# Patient Record
Sex: Male | Born: 1970 | Race: White | Hispanic: No | Marital: Single | Smoking: Current every day smoker
Health system: Southern US, Academic
[De-identification: ages and names within clinical notes are randomized; demographics above are authoritative.]

## PROBLEM LIST (undated history)

## (undated) DIAGNOSIS — L0390 Cellulitis, unspecified: Secondary | ICD-10-CM

## (undated) DIAGNOSIS — R7303 Prediabetes: Secondary | ICD-10-CM

## (undated) DIAGNOSIS — Z789 Other specified health status: Secondary | ICD-10-CM

## (undated) HISTORY — PX: NO PAST SURGERIES: SHX2092

## (undated) HISTORY — DX: Other specified health status: Z78.9

## (undated) HISTORY — PX: ADENOIDECTOMY: SUR15

---

## 2003-03-11 ENCOUNTER — Emergency Department (HOSPITAL_COMMUNITY): Payer: Self-pay | Admitting: EXTERNAL

## 2019-10-26 IMAGING — MR MRI LUMBAR SPINE WITHOUT CONTRAST
6 series · 48 of 48 positions shown · IV contrast (gadolinium)
Comparison: None available.

﻿EXAM:  MRI LUMBAR SPINE WITHOUT CONTRAST
INDICATION: Lower back pain.
TECHNIQUE: Multiplanar multisequential MRI of the lumbar spine was performed without gadolinium contrast.

[Series 4: T2 · sagittal · 5.0mm · 1.00mm/px · 5 of 13 slices shown (1 of 3)]
[im 1/13]
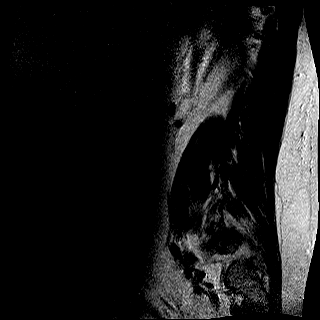
[im 4/13]
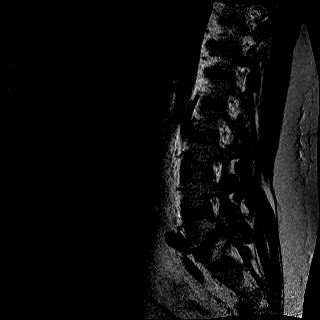
[im 7/13]
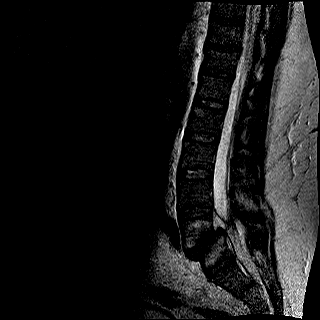
[im 10/13]
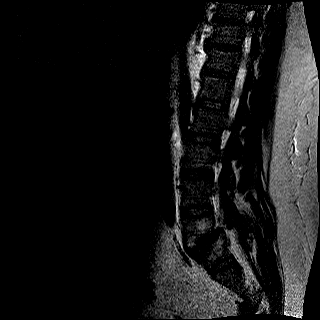
[im 13/13]
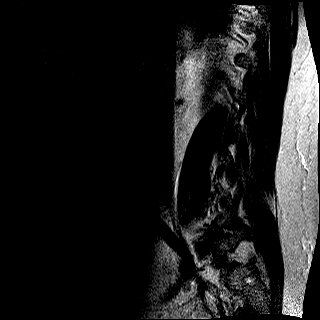

[Series 5: T1 · sagittal · 5.0mm · 1.00mm/px · 6 of 13 slices shown (1 of 2)]
[im 1/13]
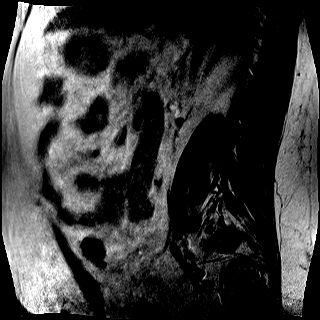
[im 3/13]
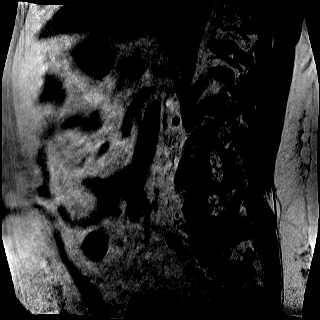
[im 5/13]
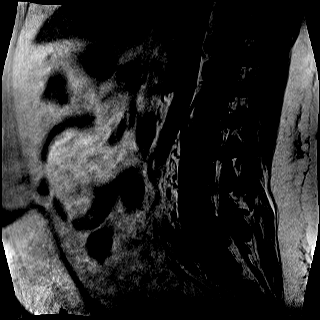
[im 8/13]
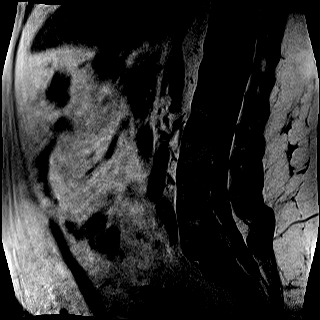
[im 10/13]
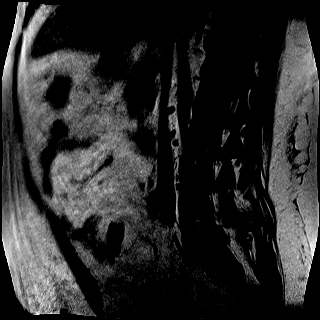
[im 13/13]
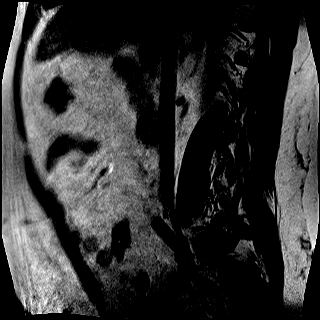

[Series 6: STIR · sagittal · 5.0mm · 1.25mm/px · 6 of 13 slices shown]
[im 1/13]
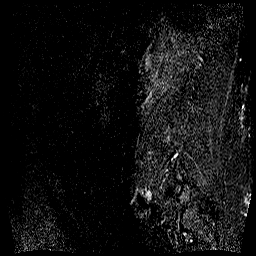
[im 3/13]
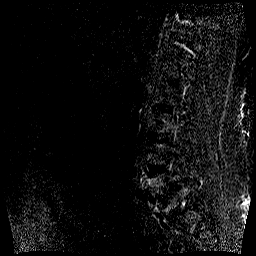
[im 5/13]
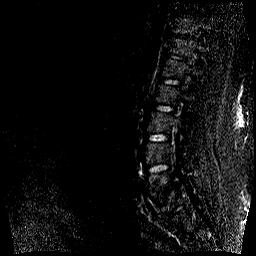
[im 8/13]
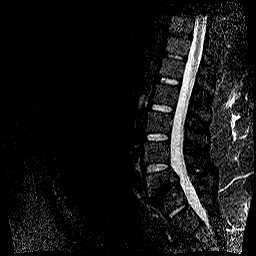
[im 10/13]
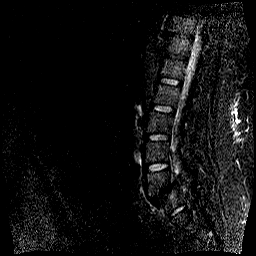
[im 13/13]
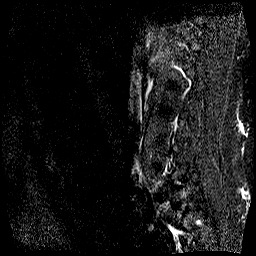

[Series 7: T2 · axial · 5.0mm · 0.89mm/px · z∈[-160,+38]mm · 11 of 25 slices shown (2 of 3)]
[im 1/25]
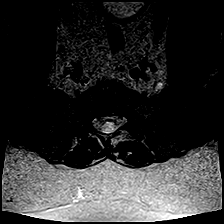
[im 3/25]
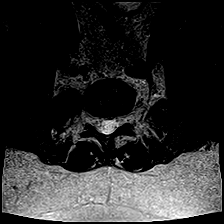
[im 5/25]
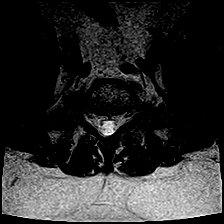
[im 8/25]
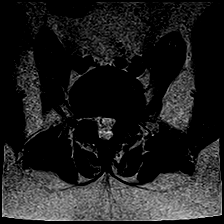
[im 10/25]
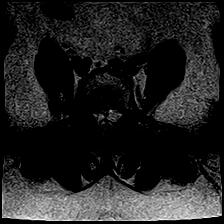
[im 13/25]
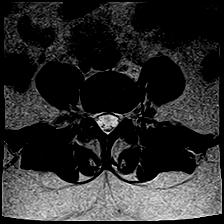
[im 15/25]
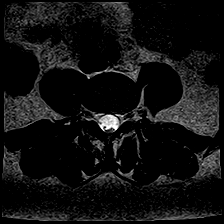
[im 17/25]
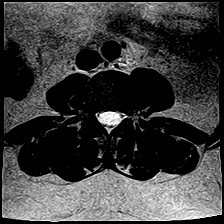
[im 20/25]
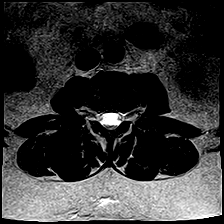
[im 22/25]
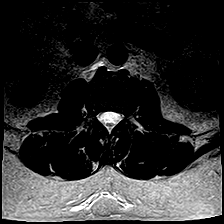
[im 25/25]
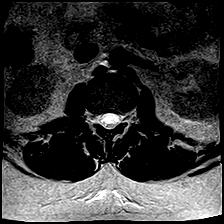

[Series 8: T1 · axial · 5.0mm · 0.89mm/px · z∈[-160,+38]mm · 11 of 25 slices shown (2 of 2)]
[im 1/25]
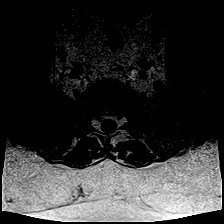
[im 3/25]
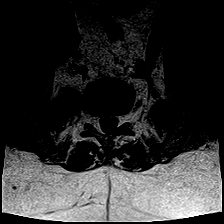
[im 5/25]
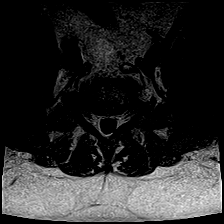
[im 8/25]
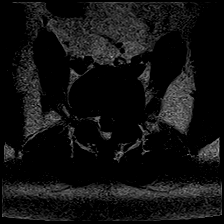
[im 10/25]
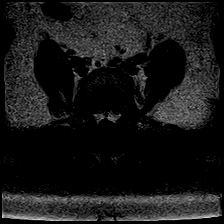
[im 13/25]
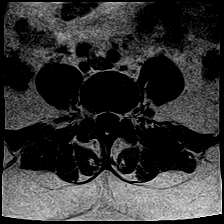
[im 15/25]
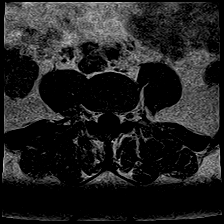
[im 17/25]
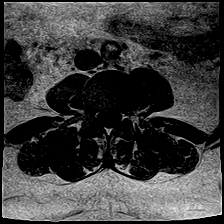
[im 20/25]
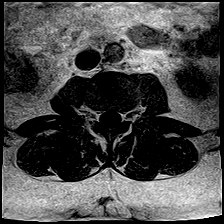
[im 22/25]
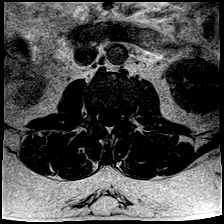
[im 25/25]
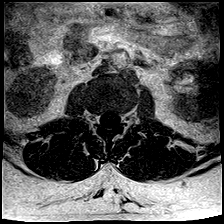

[Series 9: T2 · coronal · 4.0mm · 1.34mm/px · 9 of 20 slices shown (3 of 3)]
[im 1/20]
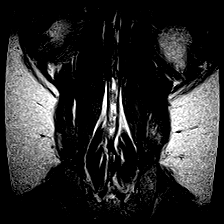
[im 3/20]
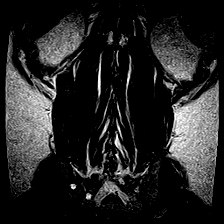
[im 5/20]
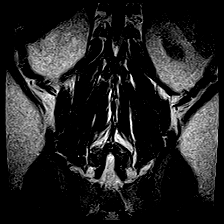
[im 8/20]
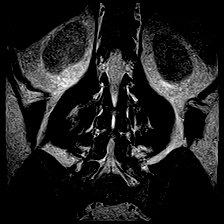
[im 10/20]
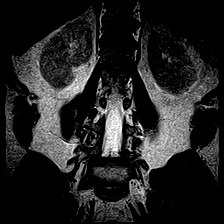
[im 12/20]
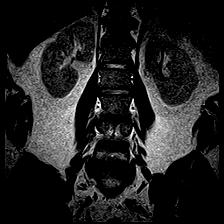
[im 15/20]
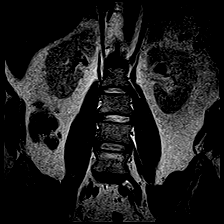
[im 17/20]
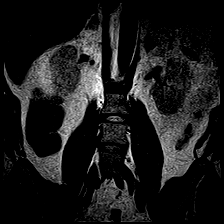
[im 20/20]
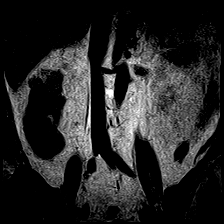

[48 of 48 positions shown; findings below may reference images not displayed]

FINDINGS: Bone marrow signal intensity is normal. There is no acute fracture or subluxation. Distal spinal cord is normal in signal intensity and terminates normally at T12 vertebral body level.

L1-2, L2-3 and L3-4 levels are unremarkable.

At L4-5 level, there is marked disc desiccation. There is also grade 1 anterolisthesis of L4 on L5 vertebral body. Modic type 2 changes are also noted within the inferior endplate of L4 and superior endplate of L5 vertebral body. There is no significant disc herniation. There is severe bilateral neural foraminal stenosis from facet arthropathy and anterolisthesis.

L5-S1 level and paraspinal soft tissues are unremarkable.
IMPRESSION: Advanced degenerative changes at L4-5 level as detailed above.

## 2019-10-26 IMAGING — MR MRI PELVIS WITHOUT CONTRAST
4 of 7 series · 21 of 48 positions shown · IV contrast (gadolinium)
Comparison: None available.

﻿EXAM:  MRI PELVIS WITHOUT CONTRAST
INDICATION: Sacral pain.
TECHNIQUE: Multiplanar multisequential MRI of the pelvis was performed without gadolinium contrast.

[Series 11: T1 · axial · 5.0mm · 0.78mm/px · z∈[-70,+112]mm · 8 of 34 slices shown (1 of 2)]
[im 1/34]
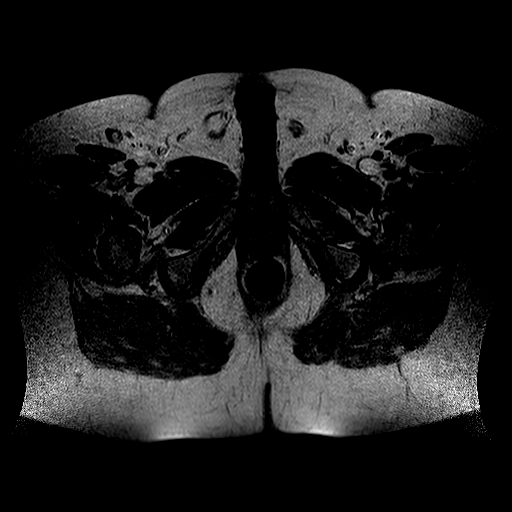
[im 5/34]
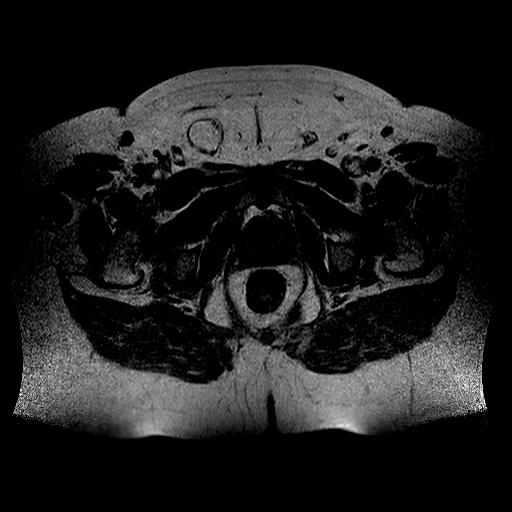
[im 10/34]
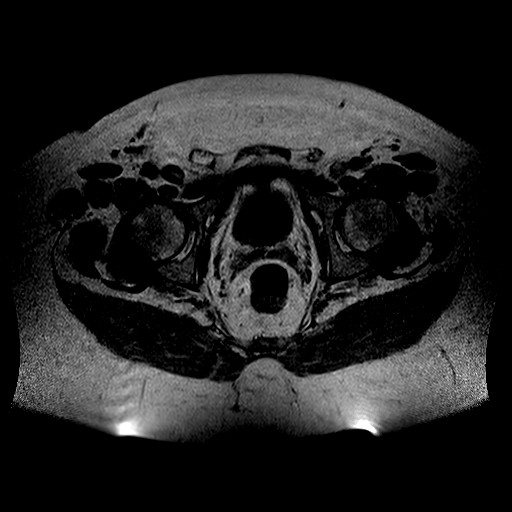
[im 15/34]
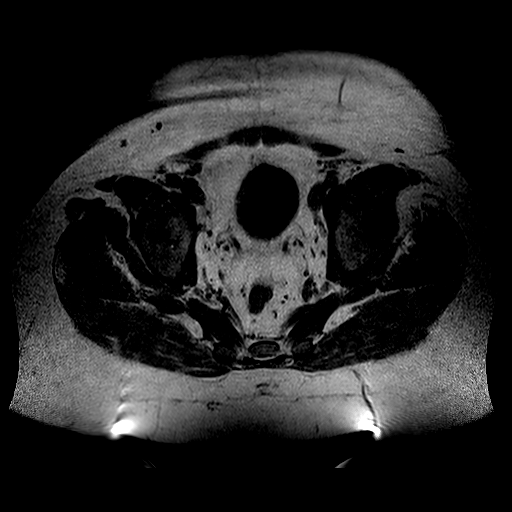
[im 19/34]
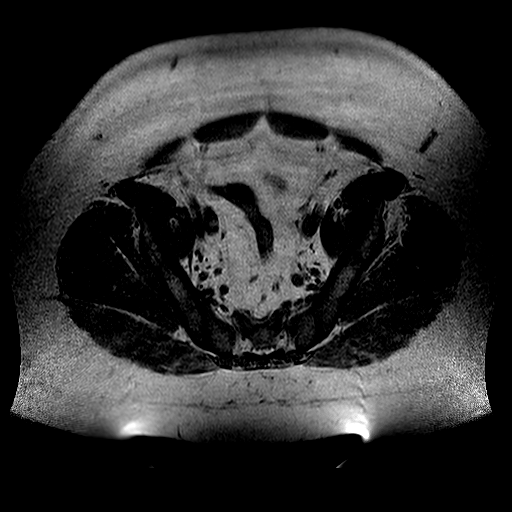
[im 24/34]
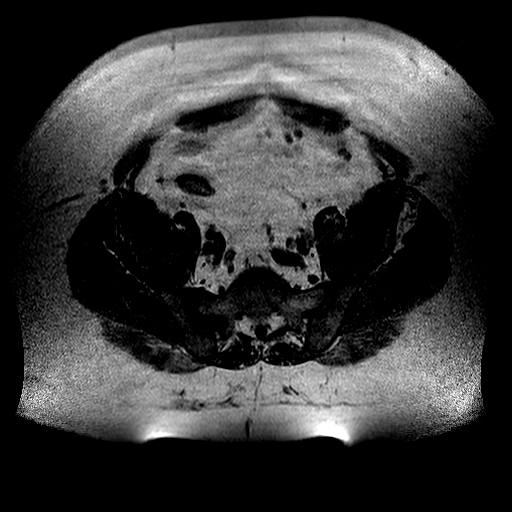
[im 29/34]
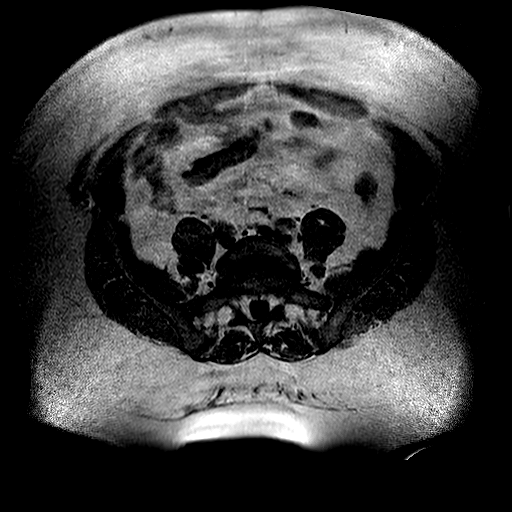
[im 34/34]
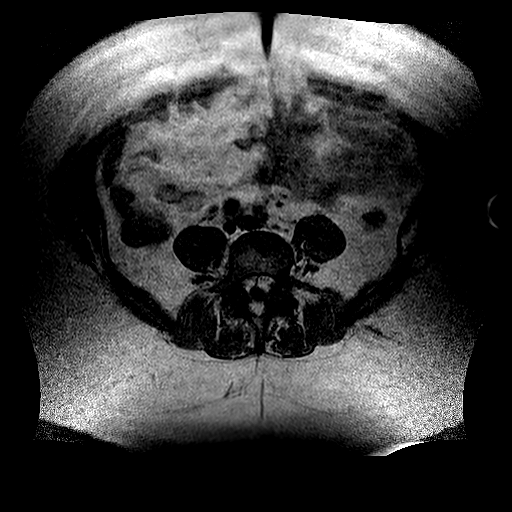

[Series 12: STIR · axial · 5.0mm · 0.78mm/px · z∈[-70,+84]mm · 7 of 34 slices shown]
[im 1/34]
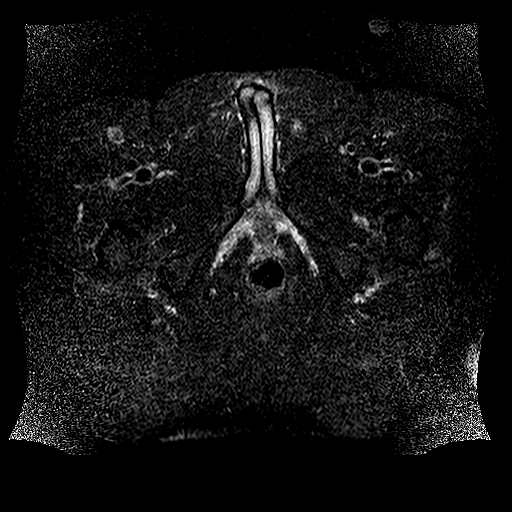
[im 5/34]
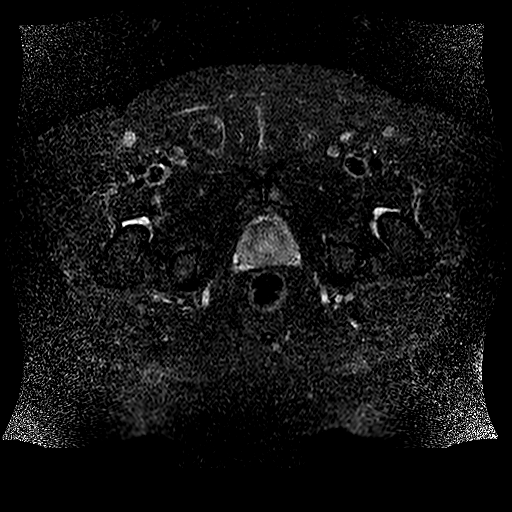
[im 10/34]
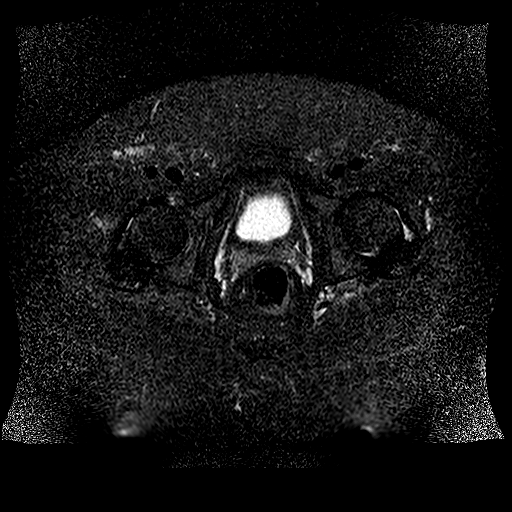
[im 15/34]
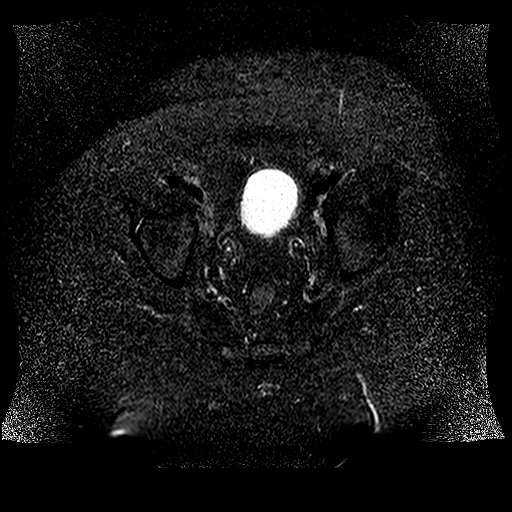
[im 19/34]
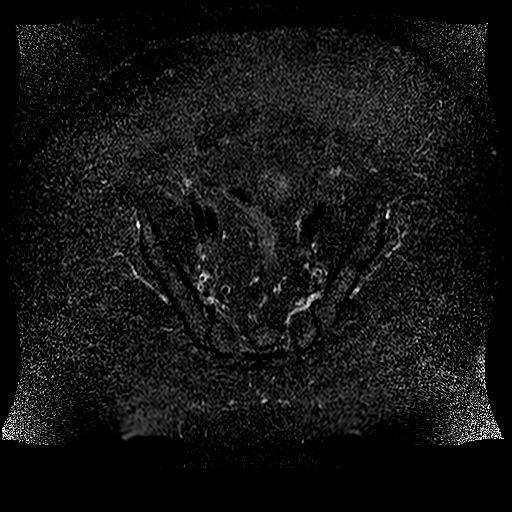
[im 24/34]
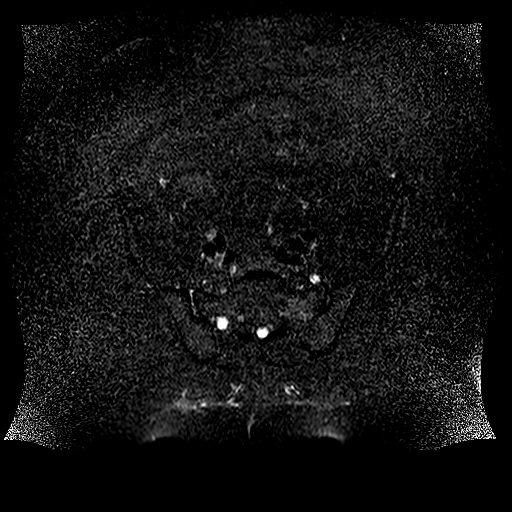
[im 29/34]
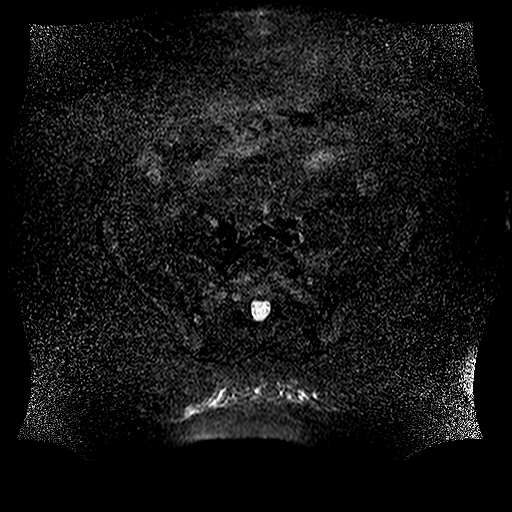

[Series 13: T2 fat-sat · axial · 5.0mm · 1.25mm/px · z∈[-42,+79]mm · 3 of 34 slices shown]
[im 6/34]
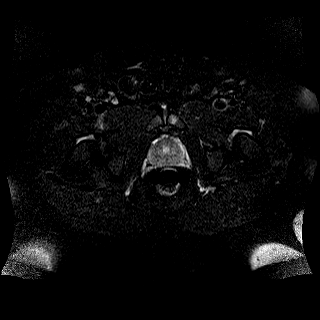
[im 17/34]
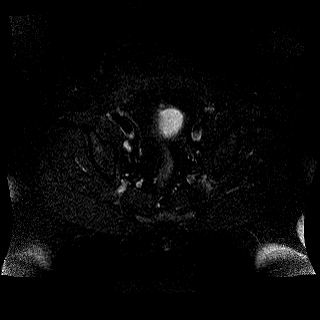
[im 28/34]
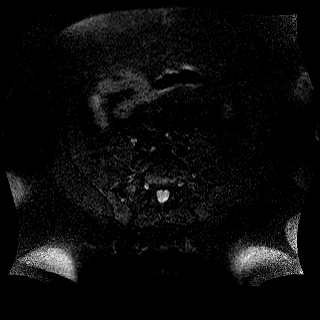

[Series 16: T1 · coronal · 5.0mm · 0.78mm/px · 3 of 28 slices shown (2 of 2)]
[im 6/28]
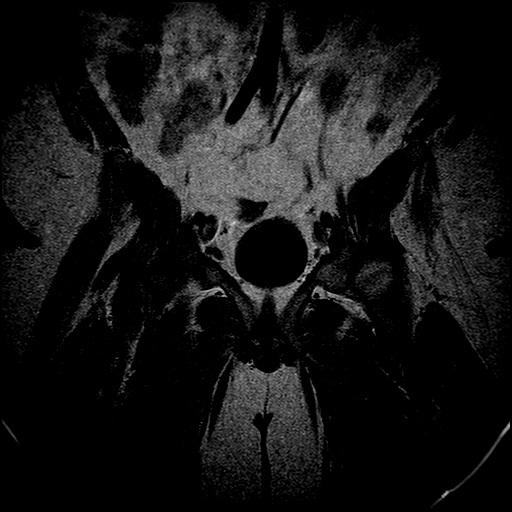
[im 17/28]
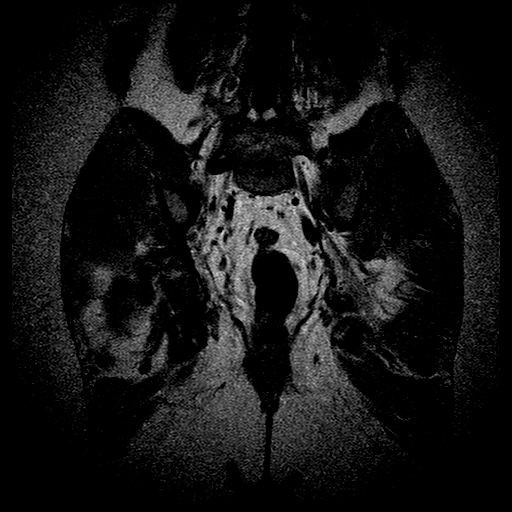
[im 28/28]
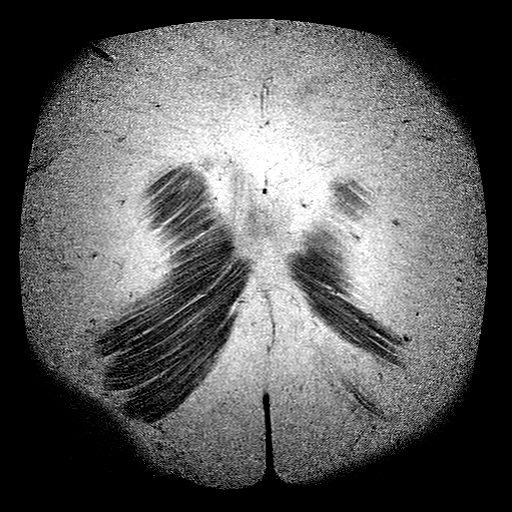

[21 of 48 positions shown; findings below may reference images not displayed]

FINDINGS: Examination is somewhat degraded due to patient's extremely large body habitus. Bone marrow signal intensity is normal. There is no acute fracture or subluxation. There is moderate osteoarthritis of the right sacroiliac joint with increased subarticular sclerosis and cystic changes within the right sacrum and posterior ilium. Hip joints are well maintained. There is no evidence of iliopsoas or greater trochanteric bursitis on either side. Additional muscles and soft tissues about the pelvic girdle also appear unremarkable. Evaluation of pelvic parenchymal structures is limited without definite abnormality. A right-sided fat containing inguinal hernia is incidentally noted.
IMPRESSION: Moderate osteoarthritis of the right sacroiliac joint.

## 2021-04-03 ENCOUNTER — Emergency Department
Admission: EM | Admit: 2021-04-03 | Discharge: 2021-04-03 | Disposition: A | Discharge status: Home / Self Care | Payer: No Typology Code available for payment source | Attending: Emergency Medicine | Admitting: Emergency Medicine

## 2021-04-03 ENCOUNTER — Other Ambulatory Visit: Payer: Self-pay

## 2021-04-03 ENCOUNTER — Emergency Department (EMERGENCY_DEPARTMENT_HOSPITAL): Payer: No Typology Code available for payment source

## 2021-04-03 ENCOUNTER — Encounter (HOSPITAL_COMMUNITY): Payer: Self-pay | Admitting: Emergency Medicine

## 2021-04-03 DIAGNOSIS — G8929 Other chronic pain: Secondary | ICD-10-CM | POA: Insufficient documentation

## 2021-04-03 DIAGNOSIS — M25559 Pain in unspecified hip: Secondary | ICD-10-CM

## 2021-04-03 DIAGNOSIS — M79604 Pain in right leg: Secondary | ICD-10-CM

## 2021-04-03 DIAGNOSIS — M549 Dorsalgia, unspecified: Secondary | ICD-10-CM | POA: Insufficient documentation

## 2021-04-03 DIAGNOSIS — M5136 Other intervertebral disc degeneration, lumbar region: Secondary | ICD-10-CM | POA: Insufficient documentation

## 2021-04-03 DIAGNOSIS — M25551 Pain in right hip: Secondary | ICD-10-CM

## 2021-04-03 MED ORDER — ACETAMINOPHEN 325 MG TABLET
ORAL_TABLET | ORAL | Status: AC
Start: 2021-04-03 — End: 2021-04-03
  Filled 2021-04-03: qty 3

## 2021-04-03 MED ORDER — KETOROLAC 30 MG/ML (1 ML) INJECTION SOLUTION
INTRAMUSCULAR | Status: AC
Start: 2021-04-03 — End: 2021-04-03
  Filled 2021-04-03: qty 1

## 2021-04-03 MED ORDER — KETOROLAC 30 MG/ML (1 ML) INJECTION SOLUTION
15.0000 mg | INTRAMUSCULAR | Status: AC
Start: 2021-04-03 — End: 2021-04-03
  Administered 2021-04-03: 15 mg via INTRAMUSCULAR

## 2021-04-03 MED ORDER — ACETAMINOPHEN 325 MG TABLET
975.0000 mg | ORAL_TABLET | ORAL | Status: AC
Start: 2021-04-03 — End: 2021-04-03
  Administered 2021-04-03: 13:00:00 975 mg via ORAL

## 2021-04-03 MED ORDER — DEXAMETHASONE SODIUM PHOSPHATE (PF) 10 MG/ML INJECTION SOLUTION
10.0000 mg | INTRAMUSCULAR | Status: AC
Start: 2021-04-03 — End: 2021-04-03
  Administered 2021-04-03: 14:00:00 10 mg via INTRAMUSCULAR

## 2021-04-03 MED ORDER — DEXAMETHASONE SODIUM PHOSPHATE (PF) 10 MG/ML INJECTION SOLUTION
INTRAMUSCULAR | Status: AC
Start: 2021-04-03 — End: 2021-04-03
  Filled 2021-04-03: qty 1

## 2021-04-03 NOTE — ED Nurses Note (Signed)
D/c instructions given and states understanding. D/c to home.

## 2021-04-03 NOTE — ED Provider Notes (Signed)
[facility]  ED Primary Provider Note  Patient Name: Charles Mendoza  Patient Age: 51 y.o.  Date of Birth: [birth year]    Chief Complaint: Hip Pain and Leg Pain        History of Present Illness       Charles Mendoza is a 51 y.o. male who had concerns including Hip Pain and Leg Pain.  The patient works in the kitchen, and had a right lateral leg related discomfort.  He denies bowel or bladder changes, saddle anesthesia, denies any overt weakness.  Patient has history of chronic back pain well, and has received an MRI in the past.  Patient self admits he believes that he has sciatic nerve.        Review of Systems     No other overt Review of Systems are noted to be positive except noted in the HPI.      Historical Data   History Reviewed This Encounter: Medical History   Surgical History   Family History   Social History        Physical Exam   ED Triage Vitals [04/03/21 1128]   BP (Non-Invasive) 107/68   Heart Rate (!) 110   Respiratory Rate 20   Temperature 36.3 C (97.3 F)   SpO2 100 %   Weight (!) 145 kg (320 lb)   Height 1.676 m (5\' 6" )       Nursing notes reviewed for what could be assessed. Past Medical, Surgical, and Social history reviewed for what has been completed. Exam limited in the setting of personal protective equipment.    Constitutional: NAD. Well-Developed. Well Nourished.  Head: Normocephalic, atraumatic.  Ears:  Normal to exterior evaluation  Eyes: EOM grossly intact, conjunctiva normal.  Neck: Supple  Cardiovascular: Regular Rate and Rhythm, extremities well perfused.  Pulmonary/Chest: No respiratory distress. Lungs are symmetric to auscultation bilaterally.  Abdominal: Soft, non-tender, non-distended. Non peritoneal, no rebound, no guarding.  MSK: No Lower Extremity Edema.  No midline back tenderness to palpation.  Right lateral muscular discomfort to palpation.  Skin: Warm, dry, and intact  Neuro: Appropriate, CN II-XII grossly intact.  No sensory deficit in the  right lower extremity.  Psych: Pleasant            Procedures      Patient Data   Labs Ordered/Reviewed - No data to display    XR HIP RIGHT W PELVIS 2-3 VIEWS   Final Result by Edi, Radresults In (03/28 1307)   1. Unremarkable plain film exam of the bony pelvis and right hip.   2. 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   3. No significant change since prior study.         Radiologist location ID: [id number]             Medical Decision Making          MDM      Studies Assessed:  Radiography          MDM Narrative:  This patient is a 51 year old male who presents after right leg pain sustained while working.  Patient states that it her on the lateral aspect of his leg, to around his ankle.  Patient has history of obtaining an MRI due to chronic related back pain resulting in no acute intervention.  The patient specifically denies cauda equina red flag findings.  After the patient received symptom based treatment, he underwent discussion of his x-ray, and was discharged with  return precautions and follow-up with his primary care provider.             Medications Administered in the ED   dexamethasone (PF) 10 mg/mL injection (10 mg IntraMUSCULAR Given 04/03/21 1349)   ketorolac (TORADOL) 30 mg/mL injection (15 mg IntraMUSCULAR Given 04/03/21 1348)   acetaminophen (TYLENOL) tablet (975 mg Oral Given 04/03/21 1300)       Following the history, physical exam, and ED workup, the patient was deemed stable and suitable for discharge. The patient/caregiver was advised to return to the ED for any new or worsening symptoms. Discharge medications, and follow-up instructions were discussed with the patient/caregiver in detail, who verbalizes understanding. The patient/caregiver is in agreement and is comfortable with the plan of care.    Disposition: Discharged         Current Discharge Medication List      You have not been prescribed any medications.       Follow up:   Mariah Milling, DO  [address]  [place]  [address]  [phone]    Call today                 Clinical Impression   Right leg pain (Primary)         There are no discharge medications for this patient.        Aleatha Borer, MD, [facility]  Department of Emergency Medicine

## 2021-04-03 NOTE — ED Triage Notes (Signed)
States pain in right hip and leg for over a year. Mri last year normal. States pain worse today and difficult to walk. No new injury.

## 2021-04-03 NOTE — Discharge Instructions (Addendum)
Thank you for allowing [place] to be part of your care.    XR IMPRESSION:  1. Unremarkable plain film exam of the bony pelvis and right hip.  2. 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  3. No significant change since prior study.    If you notice any bowel or bladder changes, or numbness, please reevaluated.    Please discuss all medications with your pharmacist to ensure there are no concerns of interactions.    Please ensure all questions or concerns are addressed prior to leaving the hospital. We want to make sure your concerns are addressed to make sure you are as safe and healthy as possible. By leaving the hospital, it is understood you are in agreement with your treatment plan.    You may have received sedating medication during your visit. Please discuss this with your discharging provider nurse as you may not be able to operate machines while the medication is in your system, or while you are taking any potentially sedating prescriptions.    Please call the hospital medical records office for a copy of your finalized results, and review them with a primary care physician, for any findings needing further attention.    If you feel your situation worsens, or does not get better in 48 hours, please see a physician for evaluation.    We encourage you to see your regular doctor as soon as possible to let them know you were seen in the emergency department. They may want to do further testing. If you do not have a doctor, please feel free to call the hospital, and ask for contact information of accepting providers. Please also discuss your vaccinations, and ensure all are up to date.

## 2021-04-04 ENCOUNTER — Telehealth (HOSPITAL_COMMUNITY): Payer: Self-pay | Admitting: Family Medicine

## 2021-04-04 NOTE — Telephone Encounter (Signed)
Post Ed Follow-Up    Post ED Follow-Up:   Document completed and/or attempted interactive contact(s) after transition to home after emergency department stay.:   Transition Facility and relevant Date:   Discharge Date: 04/03/21  Discharge from [facility] Emergency Department?: Yes  Discharge Facility: [facility]  Contacted by: Glorianne Manchester, RN  Contact method: MyChart Patient Portal  Contact completed: 04/04/2021  5:21 PM  MyChart message sent?: Yes  Interventions: no needs identified, patient provided Nurse Navigator contact number, availability, and services available

## 2021-04-11 ENCOUNTER — Other Ambulatory Visit: Payer: Self-pay

## 2021-08-31 ENCOUNTER — Other Ambulatory Visit (HOSPITAL_COMMUNITY): Payer: Self-pay | Admitting: Family Medicine

## 2021-08-31 ENCOUNTER — Encounter (HOSPITAL_COMMUNITY): Payer: Self-pay

## 2021-08-31 DIAGNOSIS — M79604 Pain in right leg: Secondary | ICD-10-CM

## 2021-09-06 ENCOUNTER — Encounter (HOSPITAL_COMMUNITY): Payer: Self-pay

## 2021-09-10 ENCOUNTER — Emergency Department
Admission: EM | Admit: 2021-09-10 | Discharge: 2021-09-10 | Disposition: A | Discharge status: Home / Self Care | Payer: No Typology Code available for payment source | Attending: Emergency Medicine | Admitting: Emergency Medicine

## 2021-09-10 ENCOUNTER — Encounter (HOSPITAL_COMMUNITY): Payer: Self-pay

## 2021-09-10 ENCOUNTER — Emergency Department (HOSPITAL_COMMUNITY): Payer: No Typology Code available for payment source

## 2021-09-10 ENCOUNTER — Other Ambulatory Visit: Payer: Self-pay

## 2021-09-10 DIAGNOSIS — R531 Weakness: Secondary | ICD-10-CM

## 2021-09-10 DIAGNOSIS — R000 Tachycardia, unspecified: Secondary | ICD-10-CM

## 2021-09-10 DIAGNOSIS — R059 Cough, unspecified: Secondary | ICD-10-CM | POA: Insufficient documentation

## 2021-09-10 DIAGNOSIS — U071 COVID-19: Secondary | ICD-10-CM

## 2021-09-10 LAB — ARTERIAL BLOOD GAS/LACTATE
%FIO2 (ARTERIAL): 21 %
BASE EXCESS (ARTERIAL): 3.1 mmol/L — ABNORMAL HIGH (ref <=2.0)
BICARBONATE (ARTERIAL): 26.4 mmol/L — ABNORMAL HIGH (ref 20.0–26.0)
CARBOXYHEMOGLOBIN: 4.2 % — ABNORMAL HIGH (ref <=1.5)
LACTATE: 0.7 mmol/L (ref <=4.0)
MET-HEMOGLOBIN: 0.2 % (ref <=2.0)
O2CT: 19.7 %
OXYHEMOGLOBIN: 89.6 % (ref 88.0–100.0)
PCO2 (ARTERIAL): 45 mm/Hg (ref 35–45)
PH (ARTERIAL): 7.4 (ref 7.35–7.45)
PO2 (ARTERIAL): 63 mm/Hg — ABNORMAL LOW (ref 80–100)

## 2021-09-10 LAB — LACTIC ACID LEVEL W/ REFLEX FOR LEVEL >2.0: LACTIC ACID: 0.7 mmol/L (ref 0.5–2.2)

## 2021-09-10 LAB — CBC WITH DIFF
BASOPHIL #: 0 10*3/uL (ref 0.00–0.30)
BASOPHIL %: 1 % (ref 0–3)
EOSINOPHIL #: 0.1 10*3/uL (ref 0.00–0.80)
EOSINOPHIL %: 1 % (ref 0–7)
HCT: 41.2 % — ABNORMAL LOW (ref 42.0–51.0)
HGB: 14 g/dL (ref 13.5–18.0)
LYMPHOCYTE #: 0.2 10*3/uL — ABNORMAL LOW (ref 1.10–5.00)
LYMPHOCYTE %: 4 % — ABNORMAL LOW (ref 25–45)
MCH: 30.7 pg (ref 27.0–32.0)
MCHC: 34 g/dL (ref 32.0–36.0)
MCV: 90.3 fL (ref 78.0–99.0)
MONOCYTE #: 1 10*3/uL (ref 0.00–1.30)
MONOCYTE %: 16 % — ABNORMAL HIGH (ref 0–12)
MPV: 7.7 fL (ref 7.4–10.4)
NEUTROPHIL #: 4.7 10*3/uL (ref 1.80–8.40)
NEUTROPHIL %: 78 % — ABNORMAL HIGH (ref 40–76)
PLATELETS: 174 10*3/uL (ref 140–440)
RBC: 4.57 10*6/uL (ref 4.20–6.00)
RDW: 13.5 % (ref 11.6–14.8)
WBC: 6 10*3/uL (ref 4.0–10.5)
WBCS UNCORRECTED: 6 10*3/uL

## 2021-09-10 LAB — BLUE TOP TUBE

## 2021-09-10 LAB — COVID-19, FLU A/B, RSV RAPID BY PCR
INFLUENZA VIRUS TYPE A: NOT DETECTED
INFLUENZA VIRUS TYPE B: NOT DETECTED
RESPIRATORY SYNCTIAL VIRUS (RSV): NOT DETECTED
SARS-CoV-2: DETECTED — AB

## 2021-09-10 LAB — COMPREHENSIVE METABOLIC PANEL, NON-FASTING
ALBUMIN/GLOBULIN RATIO: 1.7 — ABNORMAL HIGH (ref 0.8–1.4)
ALBUMIN: 4.3 g/dL (ref 3.5–5.7)
ALKALINE PHOSPHATASE: 55 U/L (ref 34–104)
ALT (SGPT): 32 U/L (ref 7–52)
ANION GAP: 6 mmol/L (ref 4–13)
AST (SGOT): 20 U/L (ref 13–39)
BILIRUBIN TOTAL: 0.4 mg/dL (ref 0.3–1.2)
BUN/CREA RATIO: 17 (ref 6–22)
BUN: 14 mg/dL (ref 7–25)
CALCIUM, CORRECTED: 9 mg/dL (ref 8.9–10.8)
CALCIUM: 9.3 mg/dL (ref 8.6–10.3)
CHLORIDE: 103 mmol/L (ref 98–107)
CO2 TOTAL: 26 mmol/L (ref 21–31)
CREATININE: 0.83 mg/dL (ref 0.60–1.30)
ESTIMATED GFR: 108 mL/min/{1.73_m2} (ref >59)
GLOBULIN: 2.5 — ABNORMAL LOW (ref 2.9–5.4)
GLUCOSE: 100 mg/dL (ref 74–109)
OSMOLALITY, CALCULATED: 271 mOsm/kg (ref 270–290)
POTASSIUM: 4.2 mmol/L (ref 3.5–5.1)
PROTEIN TOTAL: 6.8 g/dL (ref 6.4–8.9)
SODIUM: 135 mmol/L — ABNORMAL LOW (ref 136–145)

## 2021-09-10 LAB — GOLD TOP TUBE

## 2021-09-10 LAB — LIGHT GREEN TOP TUBE

## 2021-09-10 MED ORDER — CETIRIZINE 10 MG TABLET
10.0000 mg | ORAL_TABLET | Freq: Every day | ORAL | 0 refills | Status: AC
Start: 2021-09-10 — End: 2021-09-24

## 2021-09-10 MED ORDER — TAMSULOSIN 0.4 MG CAPSULE
0.4000 mg | ORAL_CAPSULE | ORAL | 0 refills | Status: DC
Start: 2021-09-10 — End: 2021-09-10

## 2021-09-10 MED ORDER — IPRATROPIUM 0.5 MG-ALBUTEROL 3 MG (2.5 MG BASE)/3 ML NEBULIZATION SOLN
6.0000 mL | INHALATION_SOLUTION | RESPIRATORY_TRACT | Status: AC
Start: 2021-09-10 — End: 2021-09-10
  Administered 2021-09-10: 6 mL via RESPIRATORY_TRACT

## 2021-09-10 MED ORDER — SODIUM CHLORIDE 0.9 % IV BOLUS
1000.0000 mL | INJECTION | Status: AC
Start: 2021-09-10 — End: 2021-09-10
  Administered 2021-09-10: 0 mL via INTRAVENOUS
  Administered 2021-09-10: 1000 mL via INTRAVENOUS

## 2021-09-10 MED ORDER — ACETAMINOPHEN 325 MG TABLET
650.0000 mg | ORAL_TABLET | ORAL | Status: AC
Start: 2021-09-10 — End: 2021-09-10
  Administered 2021-09-10: 650 mg via ORAL

## 2021-09-10 MED ORDER — FLUTICASONE PROPIONATE 50 MCG/ACTUATION NASAL SPRAY,SUSPENSION
2.0000 | Freq: Every day | NASAL | 0 refills | Status: AC
Start: 2021-09-10 — End: 2021-09-17

## 2021-09-10 MED ORDER — ACETAMINOPHEN 325 MG TABLET
ORAL_TABLET | ORAL | Status: AC
Start: 2021-09-10 — End: 2021-09-10
  Filled 2021-09-10: qty 2

## 2021-09-10 MED ORDER — IPRATROPIUM 0.5 MG-ALBUTEROL 3 MG (2.5 MG BASE)/3 ML NEBULIZATION SOLN
INHALATION_SOLUTION | RESPIRATORY_TRACT | Status: AC
Start: 2021-09-10 — End: 2021-09-10
  Filled 2021-09-10: qty 6

## 2021-09-10 MED ORDER — NAPROXEN 500 MG TABLET
500.0000 mg | ORAL_TABLET | Freq: Two times a day (BID) | ORAL | 0 refills | Status: DC | PRN
Start: 2021-09-10 — End: 2022-01-13

## 2021-09-10 MED ORDER — SODIUM CHLORIDE 0.9 % (FLUSH) INJECTION SYRINGE
3.0000 mL | INJECTION | Freq: Three times a day (TID) | INTRAMUSCULAR | Status: DC
Start: 2021-09-10 — End: 2021-09-10
  Administered 2021-09-10: 3 mL

## 2021-09-10 MED ORDER — ACETAMINOPHEN 500 MG TABLET
1000.0000 mg | ORAL_TABLET | Freq: Three times a day (TID) | ORAL | 0 refills | Status: AC
Start: 2021-09-10 — End: 2021-09-17

## 2021-09-10 MED ORDER — PAXLOVID 300 MG (150 MG X 2)-100 MG TABLETS IN A DOSE PACK
3.0000 | ORAL_TABLET | Freq: Two times a day (BID) | ORAL | 0 refills | Status: AC
Start: 2021-09-10 — End: 2021-09-15

## 2021-09-10 MED ORDER — SODIUM CHLORIDE 0.9 % (FLUSH) INJECTION SYRINGE
3.0000 mL | INJECTION | INTRAMUSCULAR | Status: DC | PRN
Start: 2021-09-10 — End: 2021-09-10

## 2021-09-10 MED ORDER — ALBUTEROL SULFATE HFA 90 MCG/ACTUATION AEROSOL INHALER
2.0000 | INHALATION_SPRAY | RESPIRATORY_TRACT | 2 refills | Status: DC | PRN
Start: 2021-09-10 — End: 2022-05-30

## 2021-09-10 NOTE — ED Provider Notes (Signed)
[facility]  ED Primary Provider Note  Patient Name: Charles Mendoza  Patient Age: 51 y.o.  Date of Birth: Jan 10, 1970    Chief Complaint: Cough, Congestion, and Weakness        History of Present Illness       Charles Mendoza is a 51 y.o. male who had concerns including Cough, Congestion, and Weakness.  This patient is a 51 year old male who presents continual cough.  This began within the day prior to arrival, on his way to work.  He is never carried a diagnosis of COVID.        Review of Systems     No other overt Review of Systems are noted to be positive except noted in the HPI.      Historical Data   History Reviewed This Encounter:        Physical Exam   ED Triage Vitals [09/10/21 1453]   BP (Non-Invasive) (!) 127/92   Heart Rate (!) 125   Respiratory Rate 17   Temperature 37.9 C (100.2 F)   SpO2 94 %   Weight (!) 145 kg (320 lb)   Height 1.727 m ('5\' 8"' )         Nursing notes reviewed for what could be assessed. Past Medical, Surgical, and Social history reviewed for what has been completed.    Constitutional: NAD. Well-Developed. Well Nourished.  BMI 48.  Head: Normocephalic, atraumatic.  Mouth/Throat:  Wearing a mask.  Eyes: EOM grossly intact, conjunctiva normal.  Neck: Supple  Cardiovascular:  Tachycardic Rate and Rhythm, extremities well perfused.  Pulmonary/Chest: No respiratory distress. Lungs are symmetric to auscultation bilaterally.  No obvious rhonchi.  Abdominal: Soft, non-tender, non-distended. Non peritoneal, no rebound, no guarding.  MSK: No Lower Extremity Edema.  Skin: Warm, dry, and intact  Neuro: Appropriate, CN II-XII grossly intact.   Psych: Pleasant              Procedures      Patient Data     Labs Ordered/Reviewed   COMPREHENSIVE METABOLIC PANEL, NON-FASTING - Abnormal; Notable for the following components:       Result Value    SODIUM 135 (*)     ALBUMIN/GLOBULIN RATIO 1.7 (*)     GLOBULIN 2.5 (*)     All other components within normal limits     Narrative:     Estimated Glomerular Filtration Rate (eGFR) is calculated using the CKD-EPI (2021) equation, intended for patients 41 years of age and older. If gender is not documented or "unknown", there will be no eGFR calculation.   COVID-19, FLU A/B, RSV RAPID BY PCR - Abnormal; Notable for the following components:    SARS-CoV-2 Detected (*)     All other components within normal limits    Narrative:     Results are for the simultaneous qualitative identification of SARS-CoV-2 (formerly 2019-nCoV), Influenza A, Influenza B, and RSV RNA. These etiologic agents are generally detectable in nasopharyngeal and nasal swabs during the ACUTE PHASE of infection. Hence, this test is intended to be performed on respiratory specimens collected from individuals with signs and symptoms of upper respiratory tract infection who meet Centers for Disease Control and Prevention (CDC) clinical and/or epidemiological criteria for Coronavirus Disease 2019 (COVID-19) testing. CDC COVID-19 criteria for testing on human specimens is available at [facility] webpage information for Healthcare Professionals: Coronavirus Disease 2019 (COVID-19) ([employer]).     False-negative results may occur if the virus has genomic mutations, insertions, deletions,  or rearrangements or if performed very early in the course of illness. Otherwise, negative results indicate virus specific RNA targets are not detected, however negative results do not preclude SARS-CoV-2 infection/COVID-19, Influenza, or Respiratory syncytial virus infection. Results should not be used as the sole basis for patient management decisions. Negative results must be combined with clinical observations, patient history, and epidemiological information. If upper respiratory tract infection is still suspected based on exposure history together with other clinical findings, re-testing should be considered.    Disclaimer:   This assay has been  authorized by FDA under an Emergency Use Authorization for use in laboratories certified under the Clinical Laboratory Improvement Amendments of 1988 (CLIA), 42 U.S.C. 250 173 2484, to perform high complexity tests. The impacts of vaccines, antiviral therapeutics, antibiotics, chemotherapeutic or immunosuppressant drugs have not been evaluated.     Test methodology:   Cepheid Xpert Xpress SARS-CoV-2/Flu/RSV Assay real-time polymerase chain reaction (RT-PCR) test on the GeneXpert Dx and Xpert Xpress systems.   CBC WITH DIFF - Abnormal; Notable for the following components:    HCT 41.2 (*)     NEUTROPHIL % 78 (*)     LYMPHOCYTE % 4 (*)     MONOCYTE % 16 (*)     LYMPHOCYTE # 0.20 (*)     All other components within normal limits   ARTERIAL BLOOD GAS/LACTATE - Abnormal; Notable for the following components:    BICARBONATE (ARTERIAL) 26.4 (*)     BASE EXCESS (ARTERIAL) 3.1 (*)     CARBOXYHEMOGLOBIN 4.2 (*)     PO2 (ARTERIAL) 63 (*)     All other components within normal limits   LACTIC ACID LEVEL W/ REFLEX FOR LEVEL >2.0 - Normal   ADULT ROUTINE BLOOD CULTURE, SET OF 2 BOTTLES (BACTERIA AND YEAST)   ADULT ROUTINE BLOOD CULTURE, SET OF 2 BOTTLES (BACTERIA AND YEAST)   CBC/DIFF    Narrative:     The following orders were created for panel order CBC/DIFF.  Procedure                               Abnormality         Status                     ---------                               -----------         ------                     CBC WITH WJXB[[id number]]                Abnormal            Final result                 Please view results for these tests on the individual orders.   BLUE TOP TUBE   EXTRA TUBES    Narrative:     The following orders were created for panel order EXTRA TUBES.  Procedure                               Abnormality         Status                     ---------                               -----------         ------  BLUE TOP QMGQ[[id number]]                                    Final result                GOLD TOP OIZT[[id number]]                                    In process                 LIGHT GREEN TOP JASN[[id number]]                             In process                   Please view results for these tests on the individual orders.   GOLD TOP TUBE   LIGHT GREEN TOP TUBE       XR CHEST AP   Final Result by Edi, Radresults In (09/04 1610)   NO ACUTE FINDINGS.         Radiologist location ID: [place] Decision Making          Medical Decision Making        Studies Assessed:  Lab, radiology          MDM Narrative:  This patient is a 51 year old male who presents with upper respiratory type symptoms including coughing.  He was also tachycardic.  His symptoms did improve with therapy.  He did test positive for COVID-19.  I did have discussion regarding Paxlovid in offered the patient the option to proceed with, or not proceed with this type treatment.  Also discussed the package insert he would need to review. Patient does not appear to have any contraindications to the therapy at this point in time.             Medications Administered in the ED   NS flush syringe (3 mL Intracatheter Given 09/10/21 1500)   NS flush syringe (has no administration in time range)   NS bolus infusion 1,000 mL (0 mL Intravenous Stopped 09/10/21 1728)   acetaminophen (TYLENOL) tablet (650 mg Oral Given 09/10/21 1528)   ipratropium-albuterol 0.5 mg-3 mg(2.5 mg base)/3 mL Solution for Nebulization (6 mL Nebulization Given 09/10/21 1640)       Following the history, physical exam, and ED workup, the patient was deemed stable and suitable for discharge. The patient/caregiver was advised to return to the ED for any new or worsening symptoms. Discharge medications, and follow-up instructions were discussed with the patient/caregiver in detail, who verbalizes understanding. The patient/caregiver is in agreement and is comfortable with the plan of care.    Disposition: Discharged         Current Discharge Medication  List        START taking these medications.        Details   acetaminophen 500 mg Tablet  Commonly known as: TYLENOL   1,000 mg, Oral, EVERY 8 HOURS  Qty: 42 Tablet  Refills: 0     albuterol sulfate 90 mcg/actuation oral inhaler  Commonly known as: PROVENTIL or VENTOLIN or PROAIR   2 Puffs, Inhalation, EVERY 4 HOURS PRN  Qty: 1 Each  Refills: 2  cetirizine 10 mg Tablet  Commonly known as: ZYRTEC   10 mg, Oral, DAILY  Qty: 14 Tablet  Refills: 0     fluticasone propionate 50 mcg/actuation Spray, Suspension  Commonly known as: FLONASE   2 Sprays, Each Nostril, DAILY  Qty: 16 g  Refills: 0     naproxen 500 mg Tablet  Commonly known as: NAPROSYN   500 mg, Oral, EVERY 12 HOURS PRN  Qty: 10 Tablet  Refills: 0     Paxlovid 300 mg (150 mg x 2)-100 mg Tablets, Dose Pack  Generic drug: nirmatrelvir-ritonavir   3 Tablets, Oral, EVERY 12 HOURS  Qty: 30 Tablet  Refills: 0            Follow up:   Peri Maris, DO  [address]  [place] [address]  [phone]    Call in 1 day                 Clinical Impression   Cough, unspecified type (Primary)   COVID         Current Discharge Medication List        START taking these medications    Details   acetaminophen (TYLENOL) 500 mg Oral Tablet Take 2 Tablets (1,000 mg total) by mouth Every 8 hours for 7 days  Qty: 42 Tablet, Refills: 0      albuterol sulfate (PROVENTIL OR VENTOLIN OR PROAIR) 90 mcg/actuation Inhalation oral inhaler Take 2 Puffs by inhalation Every 4 hours as needed  Qty: 1 Each, Refills: 2      cetirizine (ZYRTEC) 10 mg Oral Tablet Take 1 Tablet (10 mg total) by mouth Once a day for 14 days  Qty: 14 Tablet, Refills: 0      naproxen (NAPROSYN) 500 mg Oral Tablet Take 1 Tablet (500 mg total) by mouth Every 12 hours as needed for Pain  Qty: 10 Tablet, Refills: 0      nirmatrelvir-ritonavir (PAXLOVID) 300 mg (150 mg x 2)-100 mg Oral Tablets, Dose Pack Take 3 Tablets by mouth Every 12 hours for 5 days  Qty: 30 Tablet, Refills: 0      tamsulosin (FLOMAX) 0.4 mg Oral  Capsule Take 1 Capsule (0.4 mg total) by mouth Every 24 hours for 7 days  Qty: 7 Capsule, Refills: 0               R. Baldo Daub, MD, [facility]  Department of Emergency Medicine

## 2021-09-10 NOTE — ED Nurses Note (Signed)
Pt d/c to home. IV removed. Education given. AAOx3. Ambulatory, gait steady. D/C papers handed to pt.

## 2021-09-10 NOTE — Discharge Instructions (Addendum)
Thank you for allowing us to be part of your care.    Please discuss all medications with your pharmacist to ensure there are no concerns of interactions.    Please ensure all questions or concerns are addressed prior to leaving the hospital. We want to make sure your concerns are addressed to make sure you are as safe and healthy as possible. By leaving the hospital, it is understood you are in agreement with your treatment plan.    You may have received sedating medication during your visit. Please discuss this with your discharging provider nurse as you may not be able to operate machines while the medication is in your system, or while you are taking any potentially sedating prescriptions.    Please call the hospital medical records office for a copy of your finalized results, and review them with a primary care physician, for any findings needing further attention.    If you feel your situation worsens, or does not get better in 48 hours, please see a physician for evaluation.    We encourage you to see your regular doctor as soon as possible to let them know you were seen in the emergency department. They may want to do further testing. If you do not have a doctor, please feel free to call the hospital, and ask for contact information of accepting providers. Please also discuss your vaccinations, and ensure all are up to date.    You may use this document to take today off work or school.    If you notice any increase work of breathing, color change such as blue/gray, nasal flaring (similar to what you would see with a bull), retractions (skin being pulled around bones on the chest during breathing), please be reevaluated.      Please follow-up, which may include having a conversation, with your regular doctor within the week. If you have a contagious condition, please stay self isolated, and discuss this with your primary care provider regarding continual isolation. Please observe your oxygen for which a  oxygen meter (Pulse-Ox) can be obtained at local stores/pharmacies. If your oxygen drops below 90, please be evaluated.

## 2021-09-10 NOTE — ED Triage Notes (Signed)
States he started having cough, congestion, and weakness today.

## 2021-09-10 NOTE — ED APP Handoff Note (Signed)
[facility]  Emergency Department  Provider in Triage Note    Name: Charles Mendoza  Age: 51 y.o.  Gender: male     Subjective:   Charles Mendoza is a 51 y.o. male who presents with complaint of No chief complaint on file.  .  Body aches, weakness, cough  onset today, dizziness. Fever, tachycardia.    Objective:   There were no vitals filed for this visit.   Focused Physical Exam shows fever, body aches     Assessment:  A medical screening exam was completed.  This patient is a 51 y.o. male with initial findings showing fever, body aches    Plan:  Please see initial orders and work-up below.  This is to be continued with full evaluation in the main Emergency Department.     No current facility-administered medications for this encounter.     No results found for this or any previous visit (from the past 24 hour(s)).     Lynann Beaver Tiffany Calmes, NP-C  09/10/2021, 14:50

## 2021-09-11 ENCOUNTER — Telehealth (HOSPITAL_COMMUNITY): Payer: Self-pay

## 2021-09-11 ENCOUNTER — Other Ambulatory Visit: Payer: Self-pay

## 2021-09-11 NOTE — Progress Notes (Signed)
Post Ed Follow-Up    Post ED Follow-Up:   Document completed and/or attempted interactive contact(s) after transition to home after emergency department stay.:   Transition Facility and relevant Date:   Discharge Date: 09/10/21  Discharge from [facility] Emergency Department?: Yes  Discharge Facility: [facility]  Contacted by: London Pepper RN  Contact method: Patient/Caregiver Telephone  Contact completed: 09/11/2021 12:39 PM       London Pepper, RN

## 2021-09-14 LAB — ADULT ROUTINE BLOOD CULTURE, SET OF 2 BOTTLES (BACTERIA AND YEAST): BLOOD CULTURE, ROUTINE: NO GROWTH

## 2021-09-15 LAB — ADULT ROUTINE BLOOD CULTURE, SET OF 2 BOTTLES (BACTERIA AND YEAST): BLOOD CULTURE, ROUTINE: NO GROWTH

## 2022-01-13 ENCOUNTER — Emergency Department (HOSPITAL_COMMUNITY): Payer: No Typology Code available for payment source

## 2022-01-13 ENCOUNTER — Inpatient Hospital Stay (HOSPITAL_COMMUNITY): Payer: No Typology Code available for payment source

## 2022-01-13 ENCOUNTER — Encounter (HOSPITAL_COMMUNITY): Payer: Self-pay | Admitting: Student in an Organized Health Care Education/Training Program

## 2022-01-13 ENCOUNTER — Other Ambulatory Visit: Payer: Self-pay

## 2022-01-13 ENCOUNTER — Emergency Department
Admission: EM | Admit: 2022-01-13 | Discharge: 2022-01-13 | Disposition: A | Discharge status: Home / Self Care | Payer: No Typology Code available for payment source | Source: Home / Self Care | Attending: Student in an Organized Health Care Education/Training Program | Admitting: Student in an Organized Health Care Education/Training Program

## 2022-01-13 DIAGNOSIS — M4726 Other spondylosis with radiculopathy, lumbar region: Secondary | ICD-10-CM | POA: Insufficient documentation

## 2022-01-13 DIAGNOSIS — M5416 Radiculopathy, lumbar region: Secondary | ICD-10-CM

## 2022-01-13 MED ORDER — DEXAMETHASONE SODIUM PHOSPHATE (PF) 10 MG/ML INJECTION SOLUTION
10.0000 mg | INTRAMUSCULAR | Status: AC
Start: 2022-01-13 — End: 2022-01-13
  Administered 2022-01-13: 10 mg via INTRAMUSCULAR

## 2022-01-13 MED ORDER — NAPROXEN 500 MG TABLET
500.0000 mg | ORAL_TABLET | Freq: Two times a day (BID) | ORAL | 0 refills | Status: DC
Start: 2022-01-13 — End: 2022-05-30

## 2022-01-13 MED ORDER — KETOROLAC 30 MG/ML (1 ML) INJECTION SOLUTION
INTRAMUSCULAR | Status: AC
Start: 2022-01-13 — End: 2022-01-13
  Filled 2022-01-13: qty 1

## 2022-01-13 MED ORDER — METHOCARBAMOL 500 MG TABLET
1000.0000 mg | ORAL_TABLET | Freq: Four times a day (QID) | ORAL | 0 refills | Status: AC
Start: 2022-01-13 — End: 2022-01-20

## 2022-01-13 MED ORDER — METHOCARBAMOL 500 MG TABLET
1000.0000 mg | ORAL_TABLET | Freq: Four times a day (QID) | ORAL | Status: DC
Start: 2022-01-13 — End: 2022-01-13
  Administered 2022-01-13: 1000 mg via ORAL

## 2022-01-13 MED ORDER — METHOCARBAMOL 500 MG TABLET
ORAL_TABLET | ORAL | Status: AC
Start: 2022-01-13 — End: 2022-01-13
  Filled 2022-01-13: qty 2

## 2022-01-13 MED ORDER — DEXAMETHASONE SODIUM PHOSPHATE (PF) 10 MG/ML INJECTION SOLUTION
INTRAMUSCULAR | Status: AC
Start: 2022-01-13 — End: 2022-01-13
  Filled 2022-01-13: qty 1

## 2022-01-13 MED ORDER — PREDNISONE 50 MG TABLET
50.0000 mg | ORAL_TABLET | Freq: Every day | ORAL | 0 refills | Status: AC
Start: 2022-01-13 — End: 2022-01-18

## 2022-01-13 MED ORDER — KETOROLAC 30 MG/ML (1 ML) INJECTION SOLUTION
30.0000 mg | INTRAMUSCULAR | Status: AC
Start: 2022-01-13 — End: 2022-01-13
  Administered 2022-01-13: 30 mg via INTRAMUSCULAR

## 2022-01-13 NOTE — ED Provider Notes (Signed)
[facility]  ED Primary Provider Note  History of Present Illness   Chief Complaint   Patient presents with    Leg Pain     Charles Mendoza is a 52 y.o. male who had concerns including Leg Pain.  Arrival: The patient arrived by Car    Patient is a 52 year old male arrives today complaining of right lower back pain radiates to right leg.  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  Patient endorses a history of the same states symptoms feel similar to his previous sciatic nerve flares.  Patient denies any recent trauma denies neck pain thoracic pain.  Patient reports bowel movements denies urinary incontinence, urinary retention, saddle anesthesia, shortness of breath, chest pain, heart palpitations.      History Reviewed This Encounter: Medical History  Surgical History  Family History  Social History    Physical Exam   ED Triage Vitals [01/13/22 1946]   BP (Non-Invasive) (!) 193/144   Heart Rate (!) 110   Respiratory Rate 20   Temperature 36.3 C (97.3 F)   SpO2 96 %   Weight 136 kg (300 lb)   Height 1.702 m (5\' 7" )     Physical Exam  Vitals and nursing note reviewed.   Constitutional:       Appearance: Normal appearance. He is obese.   HENT:      Head: Normocephalic and atraumatic.   Cardiovascular:      Rate and Rhythm: Normal rate and regular rhythm.      Pulses: Normal pulses.      Heart sounds: Normal heart sounds.   Pulmonary:      Effort: Pulmonary effort is normal.      Breath sounds: Normal breath sounds.   Musculoskeletal:      Cervical back: Normal.      Thoracic back: Normal.      Lumbar back: Tenderness present. Decreased range of motion. Negative right straight leg raise test and negative left straight leg raise test.        Back:    Skin:     General: Skin is warm.      Capillary Refill: Capillary refill takes less than 2 seconds.   Neurological:       General: No focal deficit present.      Mental Status: He is alert and oriented to person, place, and time. Mental status is at baseline.   Psychiatric:         Mood and Affect: Mood normal.         Behavior: Behavior normal.         Thought Content: Thought content normal.         Judgment: Judgment normal.       Patient Data   Labs Ordered/Reviewed - No data to display  XR HIP RIGHT W PELVIS 2-3 VIEWS   Final Result by Edi, Radresults In (01/07 2017)   NEGATIVE HIP SERIES.                Radiologist location ID: [id number]         XR LUMBAR SPINE AP AND LAT   Final Result by Edi, Radresults In (01/07 2017)   MILD DEGENERATIVE CHANGES OF THE LUMBAR SPINE. NO SIGNIFICANT CHANGE FROM PRIOR.               Radiologist location ID:  [id number]         XR FEMUR RIGHT   Final Result by Edi, Radresults In (01/07 2015)   NEGATIVE FEMUR             Radiologist location ID: [id number]           Medical Decision Making        Medical Decision Making  X-ray right hip unremarkable for any acute findings.  XR femur unremarkable for any acute findings.  XR lumbar spine unremarkable for any acute findings.  Patient did receive Decadron Toradol on Robaxin in the emergency department.  Patient was fit for discharge at this time.  Prescription for prednisone naproxen and Robaxin given to patient upon discharge.  Patient advised strict ED return precautions.  All questions and concerns addressed.    Amount and/or Complexity of Data Reviewed  Radiology:  Decision-making details documented in ED Course.    Risk  Prescription drug management.      ED Course as of 01/13/22 2038   Wynelle Link Jan 13, 2022   2017 XR LUMBAR SPINE AP AND LAT    IMPRESSION:  MILD DEGENERATIVE CHANGES OF THE LUMBAR SPINE. NO SIGNIFICANT CHANGE FROM PRIOR.           2018 XR FEMUR RIGHT  IMPRESSION:  NEGATIVE FEMUR      2018 XR HIP RIGHT W PELVIS 2-3 VIEWS   2018 XR HIP RIGHT W PELVIS 2-3 VIEWS  IMPRESSION:  NEGATIVE HIP SERIES.               Medications Administered  in the ED   methocarbamol (ROBAXIN) tablet (1,000 mg Oral Given 01/13/22 2025)   ketorolac (TORADOL) 30 mg/mL injection (30 mg IntraMUSCULAR Given 01/13/22 2021)   dexAMETHasone (PF) 10 mg/mL injection (10 mg IntraMUSCULAR Given 01/13/22 2021)     Clinical Impression   Lumbar back pain with radiculopathy affecting right lower extremity (Primary)       Disposition: Discharged

## 2022-01-13 NOTE — ED Triage Notes (Signed)
Right leg pain for 2 days, worsening.  Moaning in pain at present.

## 2022-01-13 NOTE — Discharge Instructions (Addendum)

## 2022-01-13 NOTE — ED Nurses Note (Signed)
Patient discharged home.  AVS reviewed with patient.  A written copy of the AVS and discharge instructions was given to the patient.  Questions sufficiently answered as needed.  Patient encouraged to follow up with PCP as indicated.  In the event of an emergency, patient instructed to call 911 or go to the nearest emergency room.  Patient left ED via wheelchair per request and was able to get in/out of wheelchair without assist.

## 2022-01-14 ENCOUNTER — Encounter (HOSPITAL_COMMUNITY): Payer: Self-pay

## 2022-01-14 ENCOUNTER — Telehealth (HOSPITAL_COMMUNITY): Payer: Self-pay | Admitting: Family Medicine

## 2022-01-14 NOTE — Progress Notes (Signed)
01/14/22-1243-   Post Ed Follow-Up    Post ED Follow-Up:   Document completed and/or attempted interactive contact(s) after transition to home after emergency department stay.:   Transition Facility and relevant Date:   Discharge Date: 01/13/22  Discharge from [facility] Emergency Department?: Yes  Discharge Facility: [facility]  Contacted by: Clayton Lefort  Contact method: Patient/Caregiver Telephone, MyChart Patient Portal  Contact first attempt: 01/14/2022 12:42 PM  MyChart message sent?: Yes  01/14/22-1242- Clayton Lefort RN [place] Nurse Navigator

## 2022-02-18 ENCOUNTER — Other Ambulatory Visit (HOSPITAL_COMMUNITY): Payer: Self-pay | Admitting: Orthopaedic Surgery

## 2022-02-18 DIAGNOSIS — M545 Low back pain, unspecified: Secondary | ICD-10-CM

## 2022-03-04 ENCOUNTER — Inpatient Hospital Stay
Admission: RE | Admit: 2022-03-04 | Discharge: 2022-03-04 | Disposition: A | Discharge status: Home / Self Care | Payer: No Typology Code available for payment source | Source: Ambulatory Visit | Attending: Orthopaedic Surgery | Admitting: Orthopaedic Surgery

## 2022-03-04 ENCOUNTER — Other Ambulatory Visit: Payer: Self-pay

## 2022-03-04 DIAGNOSIS — M545 Low back pain, unspecified: Secondary | ICD-10-CM

## 2022-05-30 ENCOUNTER — Inpatient Hospital Stay
Admission: EM | Admit: 2022-05-30 | Discharge: 2022-06-02 | DRG: 153 | Disposition: A | Discharge status: Home / Self Care | Payer: No Typology Code available for payment source | Attending: Internal Medicine | Admitting: Internal Medicine

## 2022-05-30 ENCOUNTER — Other Ambulatory Visit: Payer: Self-pay

## 2022-05-30 ENCOUNTER — Encounter (HOSPITAL_COMMUNITY): Payer: Self-pay

## 2022-05-30 ENCOUNTER — Emergency Department (HOSPITAL_COMMUNITY): Payer: No Typology Code available for payment source

## 2022-05-30 DIAGNOSIS — B955 Unspecified streptococcus as the cause of diseases classified elsewhere: Secondary | ICD-10-CM | POA: Diagnosis present

## 2022-05-30 DIAGNOSIS — J029 Acute pharyngitis, unspecified: Secondary | ICD-10-CM | POA: Diagnosis present

## 2022-05-30 DIAGNOSIS — Z6841 Body Mass Index (BMI) 40.0 and over, adult: Secondary | ICD-10-CM

## 2022-05-30 DIAGNOSIS — J36 Peritonsillar abscess: Principal | ICD-10-CM | POA: Diagnosis present

## 2022-05-30 DIAGNOSIS — M25551 Pain in right hip: Secondary | ICD-10-CM

## 2022-05-30 DIAGNOSIS — F17210 Nicotine dependence, cigarettes, uncomplicated: Secondary | ICD-10-CM

## 2022-05-30 DIAGNOSIS — I330 Acute and subacute infective endocarditis: Secondary | ICD-10-CM

## 2022-05-30 DIAGNOSIS — R1310 Dysphagia, unspecified: Secondary | ICD-10-CM

## 2022-05-30 DIAGNOSIS — R7881 Bacteremia: Secondary | ICD-10-CM | POA: Diagnosis present

## 2022-05-30 HISTORY — DX: Cellulitis, unspecified: L03.90

## 2022-05-30 HISTORY — DX: Prediabetes: R73.03

## 2022-05-30 LAB — COMPREHENSIVE METABOLIC PANEL, NON-FASTING
ALBUMIN/GLOBULIN RATIO: 1.4 (ref 0.8–1.4)
ALBUMIN: 4.4 g/dL (ref 3.5–5.7)
ALKALINE PHOSPHATASE: 72 U/L (ref 34–104)
ALT (SGPT): 27 U/L (ref 7–52)
ANION GAP: 8 mmol/L (ref 4–13)
AST (SGOT): 24 U/L (ref 13–39)
BILIRUBIN TOTAL: 1.1 mg/dL (ref 0.3–1.2)
BUN/CREA RATIO: 19 (ref 6–22)
BUN: 14 mg/dL (ref 7–25)
CALCIUM, CORRECTED: 9.7 mg/dL (ref 8.9–10.8)
CALCIUM: 10 mg/dL (ref 8.6–10.3)
CHLORIDE: 103 mmol/L (ref 98–107)
CO2 TOTAL: 26 mmol/L (ref 21–31)
CREATININE: 0.75 mg/dL (ref 0.60–1.30)
ESTIMATED GFR: 111 mL/min/{1.73_m2} (ref >59)
GLOBULIN: 3.2 (ref 2.9–5.4)
GLUCOSE: 107 mg/dL (ref 74–109)
OSMOLALITY, CALCULATED: 275 mOsm/kg (ref 270–290)
POTASSIUM: 4.1 mmol/L (ref 3.5–5.1)
PROTEIN TOTAL: 7.6 g/dL (ref 6.4–8.9)
SODIUM: 137 mmol/L (ref 136–145)

## 2022-05-30 LAB — CBC WITH DIFF
BASOPHIL #: 0 10*3/uL (ref 0.00–0.10)
BASOPHIL %: 0 % (ref 0–1)
EOSINOPHIL #: 0 10*3/uL (ref 0.00–0.50)
EOSINOPHIL %: 0 % — ABNORMAL LOW
HCT: 42.1 % (ref 36.7–47.1)
HGB: 14 g/dL (ref 12.5–16.3)
LYMPHOCYTE #: 0.9 10*3/uL — ABNORMAL LOW (ref 1.00–3.00)
LYMPHOCYTE %: 6 % — ABNORMAL LOW (ref 16–44)
MCH: 29.6 pg (ref 23.8–33.4)
MCHC: 33.3 g/dL (ref 32.5–36.3)
MCV: 88.7 fL (ref 73.0–96.2)
MONOCYTE #: 1.1 10*3/uL — ABNORMAL HIGH (ref 0.30–1.00)
MONOCYTE %: 7 % (ref 5–13)
MPV: 7.5 fL (ref 7.4–11.4)
NEUTROPHIL #: 14.2 10*3/uL — ABNORMAL HIGH (ref 1.85–7.80)
NEUTROPHIL %: 87 % — ABNORMAL HIGH (ref 43–77)
PLATELETS: 212 10*3/uL (ref 140–440)
RBC: 4.75 10*6/uL (ref 4.06–5.63)
RDW: 13.5 % (ref 12.1–16.2)
WBC: 16.3 10*3/uL — ABNORMAL HIGH (ref 3.6–10.2)

## 2022-05-30 LAB — LACTIC ACID LEVEL W/ REFLEX FOR LEVEL >2.0: LACTIC ACID: 0.7 mmol/L (ref 0.5–2.2)

## 2022-05-30 LAB — RAPID THROAT SCREEN, STREPTOCOCCUS, WITH REFLEX: THROAT RAPID SCREEN, STREPTOCOCCUS: NEGATIVE

## 2022-05-30 LAB — MONO TEST: MONONUCLEOSIS RAPID TEST: NEGATIVE

## 2022-05-30 LAB — COVID-19, FLU A/B, RSV RAPID BY PCR
INFLUENZA VIRUS TYPE A: NOT DETECTED
INFLUENZA VIRUS TYPE B: NOT DETECTED
RESPIRATORY SYNCTIAL VIRUS (RSV): NOT DETECTED
SARS-CoV-2: NOT DETECTED

## 2022-05-30 LAB — POC BLOOD GLUCOSE (RESULTS)
GLUCOSE, POC: 115 mg/dl — ABNORMAL HIGH (ref 70–100)
GLUCOSE, POC: 160 mg/dl — ABNORMAL HIGH (ref 70–100)

## 2022-05-30 LAB — BLUE TOP TUBE

## 2022-05-30 MED ORDER — CEFTRIAXONE 1 GRAM SOLUTION FOR INJECTION
INTRAMUSCULAR | Status: AC
Start: 2022-05-30 — End: 2022-05-30
  Filled 2022-05-30: qty 10

## 2022-05-30 MED ORDER — SODIUM CHLORIDE 0.9 % INTRAVENOUS SOLUTION
INTRAVENOUS | Status: DC
Start: 2022-05-30 — End: 2022-06-02

## 2022-05-30 MED ORDER — GLUCAGON 1 MG/ML SOLUTION FOR INJECTION
1.0000 mg | Freq: Once | INTRAMUSCULAR | Status: DC | PRN
Start: 2022-05-30 — End: 2022-06-02

## 2022-05-30 MED ORDER — KETOROLAC 30 MG/ML (1 ML) INJECTION SOLUTION
30.0000 mg | Freq: Four times a day (QID) | INTRAMUSCULAR | Status: AC | PRN
Start: 2022-05-30 — End: 2022-06-02
  Administered 2022-05-30: 30 mg via INTRAVENOUS
  Filled 2022-05-30: qty 1

## 2022-05-30 MED ORDER — IOHEXOL 350 MG IODINE/ML INTRAVENOUS SOLUTION
75.0000 mL | INTRAVENOUS | Status: AC
Start: 2022-05-30 — End: 2022-05-30
  Administered 2022-05-30: 75 mL via INTRAVENOUS

## 2022-05-30 MED ORDER — SODIUM CHLORIDE 0.9 % INTRAVENOUS PIGGYBACK
1.0000 g | INTRAVENOUS | Status: AC
Start: 2022-05-30 — End: 2022-05-30
  Administered 2022-05-30: 0 g via INTRAVENOUS
  Administered 2022-05-30: 1 g via INTRAVENOUS

## 2022-05-30 MED ORDER — PROCHLORPERAZINE EDISYLATE 10 MG/2 ML (5 MG/ML) INJECTION SOLUTION
10.0000 mg | Freq: Four times a day (QID) | INTRAMUSCULAR | Status: DC | PRN
Start: 2022-05-30 — End: 2022-06-02

## 2022-05-30 MED ORDER — SODIUM CHLORIDE 0.9 % INTRAVENOUS PIGGYBACK
INJECTION | INTRAVENOUS | Status: AC
Start: 2022-05-30 — End: 2022-05-30
  Filled 2022-05-30: qty 50

## 2022-05-30 MED ORDER — DEXAMETHASONE SODIUM PHOSPHATE (PF) 10 MG/ML INJECTION SOLUTION
INTRAMUSCULAR | Status: AC
Start: 2022-05-30 — End: 2022-05-30
  Filled 2022-05-30: qty 1

## 2022-05-30 MED ORDER — INSULIN LISPRO 100 UNIT/ML SUB-Q SSIP VIAL
0.0000 [IU] | INJECTION | Freq: Four times a day (QID) | SUBCUTANEOUS | Status: DC
Start: 2022-05-30 — End: 2022-06-02
  Administered 2022-05-30 – 2022-06-02 (×12): 0 [IU] via SUBCUTANEOUS

## 2022-05-30 MED ORDER — DEXTROSE 50 % IN WATER (D50W) INTRAVENOUS SYRINGE
12.5000 g | INJECTION | INTRAVENOUS | Status: DC | PRN
Start: 2022-05-30 — End: 2022-06-02

## 2022-05-30 MED ORDER — METHYLPREDNISOLONE SOD SUCC 125 MG SOLUTION FOR INJECTION WRAPPER
125.0000 mg | INTRAVENOUS | Status: DC
Start: 2022-05-30 — End: 2022-05-30

## 2022-05-30 MED ORDER — SODIUM CHLORIDE 0.9 % (FLUSH) INJECTION SYRINGE
3.0000 mL | INJECTION | INTRAMUSCULAR | Status: DC | PRN
Start: 2022-05-30 — End: 2022-06-02

## 2022-05-30 MED ORDER — CLINDAMYCIN 900 MG/50 ML IN 0.9% SODIUM CHLORIDE INTRAVENOUS PIGGYBACK
INJECTION | INTRAVENOUS | Status: AC
Start: 2022-05-30 — End: 2022-05-30
  Filled 2022-05-30: qty 50

## 2022-05-30 MED ORDER — ENOXAPARIN 40 MG/0.4 ML SUBCUTANEOUS SYRINGE
40.0000 mg | INJECTION | SUBCUTANEOUS | Status: DC
Start: 2022-05-30 — End: 2022-06-02
  Administered 2022-05-30 – 2022-06-01 (×3): 40 mg via SUBCUTANEOUS
  Filled 2022-05-30 (×3): qty 0.4

## 2022-05-30 MED ORDER — CLINDAMYCIN 600 MG/50 ML IN 0.9% SODIUM CHLORIDE INTRAVENOUS PIGGYBACK
600.0000 mg | INJECTION | Freq: Three times a day (TID) | INTRAVENOUS | Status: DC
Start: 2022-05-30 — End: 2022-05-30
  Administered 2022-05-30: 0 mg via INTRAVENOUS

## 2022-05-30 MED ORDER — ONDANSETRON HCL (PF) 4 MG/2 ML INJECTION SOLUTION
4.0000 mg | Freq: Four times a day (QID) | INTRAMUSCULAR | Status: DC | PRN
Start: 2022-05-30 — End: 2022-06-02

## 2022-05-30 MED ORDER — CLINDAMYCIN 600 MG/50 ML IN 0.9% SODIUM CHLORIDE INTRAVENOUS PIGGYBACK
600.0000 mg | INJECTION | Freq: Three times a day (TID) | INTRAVENOUS | Status: DC
Start: 2022-05-30 — End: 2022-06-01
  Administered 2022-05-30: 600 mg via INTRAVENOUS
  Administered 2022-05-30: 0 mg via INTRAVENOUS
  Administered 2022-05-31 (×2): 600 mg via INTRAVENOUS
  Administered 2022-05-31: 0 mg via INTRAVENOUS
  Administered 2022-05-31: 600 mg via INTRAVENOUS
  Administered 2022-05-31 (×2): 0 mg via INTRAVENOUS
  Administered 2022-06-01 (×2): 600 mg via INTRAVENOUS
  Administered 2022-06-01 (×2): 0 mg via INTRAVENOUS
  Filled 2022-05-30 (×6): qty 50

## 2022-05-30 MED ORDER — DEXAMETHASONE SODIUM PHOSPHATE (PF) 10 MG/ML INJECTION SOLUTION
10.0000 mg | INTRAMUSCULAR | Status: AC
Start: 2022-05-30 — End: 2022-05-30
  Administered 2022-05-30: 10 mg via INTRAVENOUS

## 2022-05-30 MED ORDER — CLINDAMYCIN 900 MG/50 ML IN 0.9% SODIUM CHLORIDE INTRAVENOUS PIGGYBACK
900.0000 mg | INJECTION | INTRAVENOUS | Status: AC
Start: 2022-05-30 — End: 2022-05-30
  Administered 2022-05-30: 0 mg via INTRAVENOUS
  Administered 2022-05-30: 900 mg via INTRAVENOUS

## 2022-05-30 MED ORDER — SODIUM CHLORIDE 0.9 % IV BOLUS
1000.0000 mL | INJECTION | Status: AC
Start: 2022-05-30 — End: 2022-05-30
  Administered 2022-05-30: 0 mL via INTRAVENOUS
  Administered 2022-05-30: 1000 mL via INTRAVENOUS

## 2022-05-30 MED ORDER — DEXTROSE 40 % ORAL GEL
15.0000 g | ORAL | Status: DC | PRN
Start: 2022-05-30 — End: 2022-06-01

## 2022-05-30 MED ORDER — DEXAMETHASONE SODIUM PHOSPHATE (PF) 10 MG/ML INJECTION SOLUTION
10.0000 mg | Freq: Every day | INTRAMUSCULAR | Status: DC
Start: 2022-05-30 — End: 2022-06-02
  Administered 2022-05-31 – 2022-06-02 (×3): 10 mg via INTRAVENOUS
  Filled 2022-05-30 (×3): qty 1

## 2022-05-30 MED ORDER — SODIUM CHLORIDE 0.9 % (FLUSH) INJECTION SYRINGE
3.0000 mL | INJECTION | Freq: Three times a day (TID) | INTRAMUSCULAR | Status: DC
Start: 2022-05-30 — End: 2022-06-02
  Administered 2022-05-30 – 2022-06-01 (×8): 3 mL
  Administered 2022-06-02: 0 mL
  Administered 2022-06-02: 3 mL

## 2022-05-30 MED ORDER — ACETAMINOPHEN 325 MG TABLET
650.0000 mg | ORAL_TABLET | ORAL | Status: DC | PRN
Start: 2022-05-30 — End: 2022-06-02

## 2022-05-30 MED ORDER — IPRATROPIUM 0.5 MG-ALBUTEROL 3 MG (2.5 MG BASE)/3 ML NEBULIZATION SOLN
3.0000 mL | INHALATION_SOLUTION | RESPIRATORY_TRACT | Status: DC | PRN
Start: 2022-05-30 — End: 2022-06-02

## 2022-05-30 NOTE — Consults (Signed)
[facility]  MEDICINE CONSULT    Klingler, Dvante Rogier, 52 y.o. male  Date of Birth:  1970/04/01  Encounter Start Date:  05/30/2022  Inpatient Admission Date:   Date of service: 05/30/2022    Service: ENT  Requesting MD: ER    Reason for consultation:Other-listed below  Other or additional reason for consultation: Possible PTA    HPI:  Charles Mendoza is a 52 y.o. male presents with sorethroat x 1 day.  Patient states that he is having left greater than right odynophagia. Denies any fever/chills, or dyspnea. Patient denies taking any BP meds including lisinopril. Patient does state that his voice has been muffled. He did have CT that showed possible early abscess vs phlegmon.      Historical Data   Past Medical History:   Diagnosis Date    No pertinent past medical history          Past Surgical History:   Procedure Laterality Date    NO PAST SURGERIES           No Known Allergies  Family History  nc   Social History  Social History     Tobacco Use    Smoking status: Every Day     Current packs/day: 1.00     Types: Cigarettes    Smokeless tobacco: Never   Vaping Use    Vaping status: Never Used   Substance Use Topics    Alcohol use: Never    Drug use: Never            Medications Prior to Admission       None          No current facility-administered medications for this encounter.    Active Orders   There are no active orders.        ROS:   Review of Systems   Constitutional: Negative.         EXAM:  Temperature: 36.8 C (98.3 F)  Heart Rate: (!) 111  BP (Non-Invasive): (!) 144/83  SpO2: 91 %  Physical Exam  Constitutional:       Appearance: Normal appearance. He is obese.   HENT:      Head: Normocephalic and atraumatic.      Right Ear: Tympanic membrane normal.      Left Ear: Tympanic membrane normal.      Nose: Nose normal.      Mouth/Throat:      Comments: Left sided tonsillar edema and erythema, Uvula midline  Eyes:      Extraocular Movements: Extraocular movements intact.      Pupils: Pupils are equal,  round, and reactive to light.   Lymphadenopathy:      Cervical: Cervical adenopathy present.   Neurological:      Mental Status: He is alert.          Studies:  I have reviewed all available studies within the electronic medical record.    Labs:    Lab Results Today:    Results for orders placed or performed during the hospital encounter of 05/30/22 (from the past 24 hour(s))   RAPID THROAT SCREEN, STREPTOCOCCUS, WITH REFLEX    Specimen: Throat; Swab   Result Value Ref Range    THROAT RAPID SCREEN, STREPTOCOCCUS Negative Negative   COVID-19, FLU A/B, RSV RAPID BY PCR   Result Value Ref Range    SARS-CoV-2 Not Detected Not Detected    INFLUENZA VIRUS TYPE A Not Detected Not Detected  INFLUENZA VIRUS TYPE B Not Detected Not Detected    RESPIRATORY SYNCTIAL VIRUS (RSV) Not Detected Not Detected   COMPREHENSIVE METABOLIC PANEL, NON-FASTING   Result Value Ref Range    SODIUM 137 136 - 145 mmol/L    POTASSIUM 4.1 3.5 - 5.1 mmol/L    CHLORIDE 103 98 - 107 mmol/L    CO2 TOTAL 26 21 - 31 mmol/L    ANION GAP 8 4 - 13 mmol/L    BUN 14 7 - 25 mg/dL    CREATININE 1.61 0.96 - 1.30 mg/dL    BUN/CREA RATIO 19 6 - 22    ESTIMATED GFR 111 >59 mL/min/1.60m^2    ALBUMIN 4.4 3.5 - 5.7 g/dL    CALCIUM 04.5 8.6 - 40.9 mg/dL    GLUCOSE 811 74 - 914 mg/dL    ALKALINE PHOSPHATASE 72 34 - 104 U/L    ALT (SGPT) 27 7 - 52 U/L    AST (SGOT) 24 13 - 39 U/L    BILIRUBIN TOTAL 1.1 0.3 - 1.2 mg/dL    PROTEIN TOTAL 7.6 6.4 - 8.9 g/dL    ALBUMIN/GLOBULIN RATIO 1.4 0.8 - 1.4    OSMOLALITY, CALCULATED 275 270 - 290 mOsm/kg    CALCIUM, CORRECTED 9.7 8.9 - 10.8 mg/dL    GLOBULIN 3.2 2.9 - 5.4   LACTIC ACID LEVEL W/ REFLEX FOR LEVEL >2.0   Result Value Ref Range    LACTIC ACID 0.7 0.5 - 2.2 mmol/L   MONO Test   Result Value Ref Range    MONONUCLEOSIS RAPID TEST Negative Negative   CBC WITH DIFF   Result Value Ref Range    WBC 16.3 (H) 3.6 - 10.2 x10^3/uL    RBC 4.75 4.06 - 5.63 x10^6/uL    HGB 14.0 12.5 - 16.3 g/dL    HCT 78.2 95.6 - 21.3 %    MCV 88.7  73.0 - 96.2 fL    MCH 29.6 23.8 - 33.4 pg    MCHC 33.3 32.5 - 36.3 g/dL    RDW 08.6 57.8 - 46.9 %    PLATELETS 212 140 - 440 x10^3/uL    MPV 7.5 7.4 - 11.4 fL    NEUTROPHIL % 87 (H) 43 - 77 %    LYMPHOCYTE % 6 (L) 16 - 44 %    MONOCYTE % 7 5 - 13 %    EOSINOPHIL % 0 (L) %    BASOPHIL % 0 0 - 1 %    NEUTROPHIL # 14.20 (H) 1.85 - 7.80 x10^3/uL    LYMPHOCYTE # 0.90 (L) 1.00 - 3.00 x10^3/uL    MONOCYTE # 1.10 (H) 0.30 - 1.00 x10^3/uL    EOSINOPHIL # 0.00 0.00 - 0.50 x10^3/uL    BASOPHIL # 0.00 0.00 - 0.10 x10^3/uL   BLUE TOP TUBE   Result Value Ref Range    RAINBOW/EXTRA TUBE AUTO RESULT Yes        Imaging Studies:  CT: Report noted:  early abscess/phlegmon     DNR Status:  No Order    Assessment/Plan:   Active Hospital Problems    Diagnosis    Pharyngitis     CT reviewed   Airway patent  Recommend starting clindamycin and decadron.   No obvious abscess on exam  NPO  Will reevaluate in the morning.    Discussion (elaboration on assessment or recommendations):    Conchita Paris, DO

## 2022-05-30 NOTE — ED Nurses Note (Signed)
Called report to ICU Gaylyn Lambert

## 2022-05-30 NOTE — Care Plan (Signed)
Pt admitted with right tonsillar abscess, given IV steroids, antibiotics and IV fluids. Seen by ENT, admitted to ICU for close observation of airway obstruction. Desat when sleeping, ? Sleep apnea. O2 2 L/min NC in use, humidified. To keep sat 88% or above per provider. NPO for now. Continue to assess for transition readiness.     Problem: Adult Inpatient Plan of Care  Goal: Plan of Care Review  Outcome: Ongoing (see interventions/notes)  Goal: Patient-Specific Goal (Individualized)  Outcome: Ongoing (see interventions/notes)  Flowsheets (Taken 05/30/2022 1640)  Patient would like to participate in bedside shift report: Yes  Individualized Care Needs: Monitor for increased airway obstruction  Anxieties, Fears or Concerns: Worried if throat will swell more  Patient-Specific Goals (Include Timeframe): Administer antibiotics/ steroids as ordered  Plan of Care Reviewed With: patient  Goal: Absence of Hospital-Acquired Illness or Injury  Outcome: Ongoing (see interventions/notes)  Goal: Optimal Comfort and Wellbeing  Outcome: Ongoing (see interventions/notes)  Goal: Rounds/Family Conference  Outcome: Ongoing (see interventions/notes)

## 2022-05-30 NOTE — ED Triage Notes (Signed)
C/O sore throat since yesterday that is keeping patient from eating, drinking, coughing, etc. Pt states it hurts when he talks.

## 2022-05-30 NOTE — ED Nurses Note (Signed)
Patient to Charles Mendoza with complaints of "infection in his throat" that started yesterday that keeps him from eating, drinking, coughing, hacking, and spitting. Patient states that he can barely talk. He said that his throat hurts to touch as well. He brought a note in with him that states that he also needs medication to help his leg pain until he can get a referral to another specialist.

## 2022-05-30 NOTE — ED Provider Notes (Signed)
[facility]  ED Primary Provider Note  History of Present Illness   Chief Complaint   Patient presents with    Sore Throat            Arrival: The patient arrived by Car  Charles Mendoza is a 52 y.o. male who had concerns including Sore Throat.  Patient presents to the emergency room for evaluation sore throat making it difficult to talk, swallow times 1 day.  Patient states he has not had a fever or chills.  He denies any history of similar signs symptoms.  Patient denies shortness of breath at present.  He also presents stating that he needed some additional medication for pain management of his right hip, he was previously evaluated for this and was supposed to follow-up but has not followed at this time.  He denies earache, cough, runny nose, shortness breath.  He has no other associated signs and symptoms at this time.    History Reviewed This Encounter: Medical History  Surgical History  Family History  Social History reviewed    Physical Exam   ED Triage Vitals [05/30/22 1129]   BP (Non-Invasive) (!) 135/101   Heart Rate (!) 111   Resp    Temperature 36.8 C (98.3 F)   SpO2 96 %   Weight 136 kg (300 lb)   Height 1.651 m (5\' 5" )       Constitutional:  52 y.o. male who appears in no distress. Normal color, no cyanosis.   HENT:   Head: Normocephalic and atraumatic.   Mouth/Throat: Oropharynx showing erythema with swelling, the appears asymmetrical with a larger area noted up into the soft palate on the right side.  Lymphadenopathy appreciated on the right side.  Exudate noted at the pharynx..  Muffled voice  Eyes: EOMI, PERRL   Ears:  Dull tympanic membranes bilateral.  Neck: Trachea midline. Neck supple.  Cardiovascular: RRR, S1, S2 No murmurs, rubs or gallops. Intact distal pulses.  Pulmonary/Chest: BS clear and equal bilaterally.  Respirations even and nonlabored.  No respiratory distress. No wheezes, rales or chest tenderness.   Abdominal: Bowel sounds present and normal.  Abdomen soft, no tenderness, no rebound and no guarding.  Back: No midline spinal tenderness, no paraspinal tenderness, no CVA tenderness.           Musculoskeletal: No edema, tenderness or deformity.  Skin: warm and dry. No rash, erythema, pallor or cyanosis  Psychiatric: normal mood and affect. Behavior is normal.   Neurological: Patient keenly alert and responsive, easily able to raise eyebrows, facial muscles/expressions symmetric, speaking in fluent sentences, moving all extremities equally and fully, normal gait  Patient Data     Labs Ordered/Reviewed   CBC WITH DIFF - Abnormal; Notable for the following components:       Result Value    WBC 16.3 (*)     NEUTROPHIL % 87 (*)     LYMPHOCYTE % 6 (*)     EOSINOPHIL % 0 (*)     NEUTROPHIL # 14.20 (*)     LYMPHOCYTE # 0.90 (*)     MONOCYTE # 1.10 (*)     All other components within normal limits   RAPID THROAT SCREEN, STREPTOCOCCUS, WITH REFLEX - Normal    Narrative:     Walk-Away Mode   COVID-19, FLU A/B, RSV RAPID BY PCR - Normal    Narrative:     Results are for the simultaneous qualitative identification of SARS-CoV-2 (formerly 2019-nCoV), Influenza  A, Influenza B, and RSV RNA. These etiologic agents are generally detectable in nasopharyngeal and nasal swabs during the ACUTE PHASE of infection. Hence, this test is intended to be performed on respiratory specimens collected from individuals with signs and symptoms of upper respiratory tract infection who meet Centers for Disease Control and Prevention (CDC) clinical and/or epidemiological criteria for Coronavirus Disease 2019 (COVID-19) testing. CDC COVID-19 criteria for testing on human specimens is available at [facility] webpage information for Healthcare Professionals: Coronavirus Disease 2019 (COVID-19) ([email or web address]).     False-negative results may occur if the virus has genomic mutations, insertions, deletions, or rearrangements or if performed very early in the  course of illness. Otherwise, negative results indicate virus specific RNA targets are not detected, however negative results do not preclude SARS-CoV-2 infection/COVID-19, Influenza, or Respiratory syncytial virus infection. Results should not be used as the sole basis for patient management decisions. Negative results must be combined with clinical observations, patient history, and epidemiological information. If upper respiratory tract infection is still suspected based on exposure history together with other clinical findings, re-testing should be considered.    Disclaimer:   This assay has been authorized by FDA under an Emergency Use Authorization for use in laboratories certified under the Clinical Laboratory Improvement Amendments of 1988 (CLIA), 42 U.S.C. ([phone], to perform high complexity tests. The impacts of vaccines, antiviral therapeutics, antibiotics, chemotherapeutic or immunosuppressant drugs have not been evaluated.     Test methodology:   Cepheid Xpert Xpress SARS-CoV-2/Flu/RSV Assay real-time polymerase chain reaction (RT-PCR) test on the GeneXpert Dx and Xpert Xpress systems.   COMPREHENSIVE METABOLIC PANEL, NON-FASTING - Normal    Narrative:     Estimated Glomerular Filtration Rate (eGFR) is calculated using the CKD-EPI (2021) equation, intended for patients 32 years of age and older. If gender is not documented or "unknown", there will be no eGFR calculation.     LACTIC ACID LEVEL W/ REFLEX FOR LEVEL >2.0 - Normal   MONO TEST - Normal   THROAT CULTURE, BETA HEMOLYTIC STREPTOCOCCUS   ADULT ROUTINE BLOOD CULTURE, SET OF 2 BOTTLES (BACTERIA AND YEAST)   CBC/DIFF    Narrative:     The following orders were created for panel order CBC/DIFF.  Procedure                               Abnormality         Status                     ---------                               -----------         ------                     CBC WITH RUEA[[id number]]                Abnormal            Final result                  Please view results for these tests on the individual orders.   EXTRA TUBES    Narrative:     The following orders were created for panel order EXTRA TUBES.  Procedure  Abnormality         Status                     ---------                               -----------         ------                     BLUE TOP WUJW[[id number]]                                    Final result                 Please view results for these tests on the individual orders.   BLUE TOP TUBE     CT SOFT TISSUE NECK W IV CONTRAST   Final Result by Edi, Radresults In (05/23 1342)   FINDINGS CONSISTENT WITH A PHLEGMON/DEVELOPING ABSCESS IN THE RIGHT TONSILLAR REGION MEASURING UP TO 3.2 CM. THERE IS RIGHT-TO-LEFT MASS EFFECT UPON THE ADJACENT AIRWAY, WHICH REMAINS PATENT.         One or more dose reduction techniques were used (e.g., Automated exposure control, adjustment of the mA and/or kV according to patient size, use of iterative reconstruction technique).         Radiologist location ID: [id number]           Medical Decision Making        Medical Decision Making  Patient to be admitted for further evaluation and treatment.  Patient has received IV fluids, clindamycin, and Rocephin.  Patient was evaluated by hospitalist and intensivist.  Admission orders noted.  Patient to remain NPO after midnight which the patient was also notified up.  Patient was also to continue IV steroids every 8 hours as well as antibiotics IV.  Patient did have improvement with his voice as well as no distress while in the ER.  He denies shortness breath.  Patient admission orders observe.  ENT did evaluate the patient here in the emergency room and felt that the swelling was localized around the tonsil.    Amount and/or Complexity of Data Reviewed  Labs: ordered. Decision-making details documented in ED Course.  Radiology: ordered. Decision-making details documented in ED Course.  ECG/medicine tests: independent interpretation  performed.    Risk  Prescription drug management.  Decision regarding hospitalization.    Critical Care  Total time providing critical care: 0 minutes      ED Course as of 05/30/22 1521   Thu May 30, 2022   1216 Pending CT for disposition   1348 Contacted operator to speak with on-call ENT, Dr Hulan Saas.    1348 WBC(!): 16.3  Rocephin started   1353 Spoke to Dr Hulan Saas    1406 O2 saturation showing 87%, in to recheck the patient the O2 saturations are actually 97% on room air.   1425 Dr Leonette Most did come down to check pt. He did inquire about pt having rash to arms and legs. Pt denies itching, sob.  He states he does not feel like the patient should come to the floor and should go to ICU, he did add intensivist, Dr Cleon Dew, to evaluate the patient to ICU.   1510 ENT, Dr Hulan Saas, did come see the patient.  He does not feel that  there is airway involvement and it is localized around the tonsil.            Medications Administered in the ED   NS bolus infusion 1,000 mL (1,000 mL Intravenous New Bag/New Syringe 05/30/22 1241)   dexAMETHasone (PF) 10 mg/mL injection (10 mg Intravenous Given 05/30/22 1241)   iohexol (OMNIPAQUE 350) infusion (75 mL Intravenous Given 05/30/22 1309)   cefTRIAXone (ROCEPHIN) 1 g in NS 50 mL IVPB minibag (has no administration in time range)     Clinical Impression   Tonsillar abscess - Right (Primary)   Right hip pain - ongoing       Disposition: Admitted

## 2022-05-30 NOTE — ED APP Handoff Note (Signed)
[facility]  Emergency Department  Provider in Triage Note    Name: Charles Mendoza  Age: 52 y.o.  Gender: male     Subjective:   Charles Mendoza is a 52 y.o. male who presents with complaint of Sore Throat (/)  .  Pt to the ED c/o sore throat, difficulty swallowing X 2 days. Pt denies fever.  Pt denies recent abx use.   Objective:   Filed Vitals:    05/30/22 1129   BP: (!) 135/101   Pulse: (!) 111   Temp: 36.8 C (98.3 F)   SpO2: 96%      Focused Physical Exam shows muffled voice, R tonsil > Left. Tachycardiac.     Plan:  Please see initial orders and work-up below.  This is to be continued with full evaluation in the main Emergency Department.     dexAMETHasone (PF) 10 mg/mL injection, 10 mg, Intravenous, Now  NS bolus infusion 1,000 mL, 1,000 mL, Intravenous, Now       Results for orders placed or performed during the hospital encounter of 05/30/22 (from the past 24 hour(s))   CBC/DIFF    Narrative    The following orders were created for panel order CBC/DIFF.  Procedure                               Abnormality         Status                     ---------                               -----------         ------                     CBC WITH ZOXW[[id number]]                                                                 Please view results for these tests on the individual orders.      Charles Ace, FNP-C

## 2022-05-30 NOTE — ED Nurses Note (Addendum)
Notified Dr. Cleon Dew that patient's O2 dropped to 70's. He ordered oxygen saturations above 88% and whatever oxygen level it required to keep it above that. He said that patient has sleep apnea and his oxygen is dropping when he falls asleep which is what appears to be happening. Patient aware of that information.

## 2022-05-30 NOTE — Procedures (Signed)
@  [email or web address]@    Procedures    Indications for procedure: Throat pain    Anesthesia: None    Description: The flexible endoscope was gently introduced into the nostril and passed along the floor of the nose to the nasopharynx. Adenoid was minimal and eustachian tubes normal. The retropalatal airway was patent.    The endoscope was passed to the oropharynx. Base of tongue displayed normal lingual tonsils, patent valelulla, and sharply defined upright epiglottis. Retrolingual airway was patent.    The larynx displayed normal true vocal cords with good mobility. False cords were normal. Arytenoid mucosa was pink with no edema.     The piriform recesses were symmetric without secretion. The hypopharynx was symmetric without lesion.    Findings:  Left tonsillar edema, no laryngeal edema    The patient tolerated the procedure well.    Conchita Paris, DO

## 2022-05-30 NOTE — H&P (Signed)
[facility]  IP HISTORY AND PHYSICAL NOTE      Date of Admission:  05/30/2022  Hospital Day:  LOS: 0 days     52 year old with no known medical history presented with to 3 day history throat discomfort.  He has been having difficulty of recently eating and drinking.  He does have a cough.  Nonproductive.  No shortness of breath.  No fever, chills or sweats.  He was noted to have a leukocytosis on laboratory data.  A CT scan of his neck showed right tonsillar swelling.  No other significant findings.  He was started on Decadron as well as given clindamycin.  He underwent laryngoscopy by ENT.  No edema and/or compromise of his airway.    Vitals:  Temp (24hrs) Max:36.8 C (98.3 F)      Temperature: 36.8 C (98.3 F)  BP (Non-Invasive): (!) 144/83  MAP (Non-Invasive): 100 mmHG  Heart Rate: (!) 111  SpO2: 91 %    I/O:  I/O last 24 hours:    Intake/Output Summary (Last 24 hours) at 05/30/2022 1510  Last data filed at 05/30/2022 1509  Gross per 24 hour   Intake 1100 ml   Output --   Net 1100 ml       PE:  General: NAD  HEENT:  Grade 3 Mallampati airway,  no thrush, no scleral icterus, no conjunctival pallor  Cardiovascular:  Regular rate, normal S1 and S2, no murmurs or extra heart sounds, 2+ radial pulses B/L,  Respiratory:  No increased work of breathing, good air movement throughout, no wheezes or crackles  Abdomen: Soft, non-tender, non-distended  Extremities: No lower extremity edema  Skin: Warm, no rashes  Neurologic: Symmetric gaze, no facial asymmetry, interactive      Current Meds:  No current facility-administered medications for this encounter.      Labs:  Reviewed:   Results for orders placed or performed during the hospital encounter of 05/30/22 (from the past 24 hour(s))   RAPID THROAT SCREEN, STREPTOCOCCUS, WITH REFLEX    Specimen: Throat; Swab   Result Value Ref Range    THROAT RAPID SCREEN, STREPTOCOCCUS Negative Negative    Narrative    Walk-Away Mode   COVID-19, FLU A/B, RSV RAPID BY  PCR   Result Value Ref Range    SARS-CoV-2 Not Detected Not Detected    INFLUENZA VIRUS TYPE A Not Detected Not Detected    INFLUENZA VIRUS TYPE B Not Detected Not Detected    RESPIRATORY SYNCTIAL VIRUS (RSV) Not Detected Not Detected    Narrative    Results are for the simultaneous qualitative identification of SARS-CoV-2 (formerly 2019-nCoV), Influenza A, Influenza B, and RSV RNA. These etiologic agents are generally detectable in nasopharyngeal and nasal swabs during the ACUTE PHASE of infection. Hence, this test is intended to be performed on respiratory specimens collected from individuals with signs and symptoms of upper respiratory tract infection who meet Centers for Disease Control and Prevention (CDC) clinical and/or epidemiological criteria for Coronavirus Disease 2019 (COVID-19) testing. CDC COVID-19 criteria for testing on human specimens is available at [facility] webpage information for Healthcare Professionals: Coronavirus Disease 2019 (COVID-19) ([email or web address]).     False-negative results may occur if the virus has genomic mutations, insertions, deletions, or rearrangements or if performed very early in the course of illness. Otherwise, negative results indicate virus specific RNA targets are not detected, however negative results do not preclude SARS-CoV-2 infection/COVID-19, Influenza, or Respiratory syncytial virus infection. Results should not be used as the  sole basis for patient management decisions. Negative results must be combined with clinical observations, patient history, and epidemiological information. If upper respiratory tract infection is still suspected based on exposure history together with other clinical findings, re-testing should be considered.    Disclaimer:   This assay has been authorized by FDA under an Emergency Use Authorization for use in laboratories certified under the Clinical Laboratory Improvement Amendments of 1988 (CLIA), 42  [place].S[place]C. 3015936994, to perform high complexity tests. The impacts of vaccines, antiviral therapeutics, antibiotics, chemotherapeutic or immunosuppressant drugs have not been evaluated.     Test methodology:   Cepheid Xpert Xpress SARS-CoV-2/Flu/RSV Assay real-time polymerase chain reaction (RT-PCR) test on the GeneXpert Dx and Xpert Xpress systems.   CBC/DIFF    Narrative    The following orders were created for panel order CBC/DIFF.  Procedure                               Abnormality         Status                     ---------                               -----------         ------                     CBC WITH RUEA[[id number]]                Abnormal            Final result                 Please view results for these tests on the individual orders.   COMPREHENSIVE METABOLIC PANEL, NON-FASTING   Result Value Ref Range    SODIUM 137 136 - 145 mmol/L    POTASSIUM 4.1 3.5 - 5.1 mmol/L    CHLORIDE 103 98 - 107 mmol/L    CO2 TOTAL 26 21 - 31 mmol/L    ANION GAP 8 4 - 13 mmol/L    BUN 14 7 - 25 mg/dL    CREATININE 4.78 2.95 - 1.30 mg/dL    BUN/CREA RATIO 19 6 - 22    ESTIMATED GFR 111 >59 mL/min/1.83m^2    ALBUMIN 4.4 3.5 - 5.7 g/dL    CALCIUM 62.1 8.6 - 30.8 mg/dL    GLUCOSE 657 74 - 846 mg/dL    ALKALINE PHOSPHATASE 72 34 - 104 U/L    ALT (SGPT) 27 7 - 52 U/L    AST (SGOT) 24 13 - 39 U/L    BILIRUBIN TOTAL 1.1 0.3 - 1.2 mg/dL    PROTEIN TOTAL 7.6 6.4 - 8.9 g/dL    ALBUMIN/GLOBULIN RATIO 1.4 0.8 - 1.4    OSMOLALITY, CALCULATED 275 270 - 290 mOsm/kg    CALCIUM, CORRECTED 9.7 8.9 - 10.8 mg/dL    GLOBULIN 3.2 2.9 - 5.4    Narrative    Estimated Glomerular Filtration Rate (eGFR) is calculated using the CKD-EPI (2021) equation, intended for patients 62 years of age and older. If gender is not documented or "unknown", there will be no eGFR calculation.     LACTIC ACID LEVEL W/ REFLEX FOR LEVEL >2.0   Result Value Ref Range    LACTIC ACID 0.7 0.5 - 2.2 mmol/L  MONO Test   Result Value Ref Range    MONONUCLEOSIS RAPID TEST  Negative Negative   CBC WITH DIFF   Result Value Ref Range    WBC 16.3 (H) 3.6 - 10.2 x10^3/uL    RBC 4.75 4.06 - 5.63 x10^6/uL    HGB 14.0 12.5 - 16.3 g/dL    HCT 82.9 56.2 - 13.0 %    MCV 88.7 73.0 - 96.2 fL    MCH 29.6 23.8 - 33.4 pg    MCHC 33.3 32.5 - 36.3 g/dL    RDW 86.5 78.4 - 69.6 %    PLATELETS 212 140 - 440 x10^3/uL    MPV 7.5 7.4 - 11.4 fL    NEUTROPHIL % 87 (H) 43 - 77 %    LYMPHOCYTE % 6 (L) 16 - 44 %    MONOCYTE % 7 5 - 13 %    EOSINOPHIL % 0 (L) %    BASOPHIL % 0 0 - 1 %    NEUTROPHIL # 14.20 (H) 1.85 - 7.80 x10^3/uL    LYMPHOCYTE # 0.90 (L) 1.00 - 3.00 x10^3/uL    MONOCYTE # 1.10 (H) 0.30 - 1.00 x10^3/uL    EOSINOPHIL # 0.00 0.00 - 0.50 x10^3/uL    BASOPHIL # 0.00 0.00 - 0.10 x10^3/uL   EXTRA TUBES    Narrative    The following orders were created for panel order EXTRA TUBES.  Procedure                               Abnormality         Status                     ---------                               -----------         ------                     BLUE TOP EXBM[[id number]]                                    Final result                 Please view results for these tests on the individual orders.   BLUE TOP TUBE   Result Value Ref Range    RAINBOW/EXTRA TUBE AUTO RESULT Yes           Radiology:  Reviewed:   Results for orders placed or performed during the hospital encounter of 05/30/22 (from the past 24 hour(s))   CT SOFT TISSUE NECK W IV CONTRAST     Status: None    Narrative    Sloan W Lemler    RADIOLOGIST: Valda Favia, MD    CT SOFT TISSUE NECK W IV CONTRAST performed on 05/30/2022 1:06 PM    CLINICAL HISTORY: tonsilar hypertrophy R> L.  sore throat, flu like sx, poor historian    TECHNIQUE: CT imaging of the soft tissues of the neck from the orbits to the upper mediastinum with intravenous contrast.    CONTRAST:  75 mL of Omnipaque 350    COMPARISON: CT soft tissue neck 05/27/2018.    FINDINGS:  Airway: There is right-to-left mass  effect on the airway within the oropharynx secondary to right  tonsillar swelling. Given this, the airway appears patent throughout.    Pharyngeal mucosal space: There is asymmetric soft tissue swelling involving the right tonsillar region with an ill-defined hypoechoic region measuring approximately 3.2 x 2.4 cm that is favored to represent a phlegmon/developing abscess.    Calcifications are again seen within both tonsillar regions, not significantly changed from the comparison study of 2020. These are nonspecific but may represent sequela of prior infection.    Hypopharynx and larynx:  Unremarkable.      Parapharyngeal and retropharyngeal spaces: Unremarkable.    Masticator and buccal spaces:  Unremarkable.    Salivary glands: Unremarkable.    Lymph nodes:  Mildly prominent upper cervical chain lymph nodes, right greater the left, are nonspecific, presumably reactive.    Thyroid:  Unremarkable    Vasculature:  Carotid arteries and internal jugular veins are unremarkable.     Orbits:  Unremarkable at visualized levels.    Paranasal sinuses and mastoids: Grossly clear at visualized levels.    Lung apices:  Clear.  Upper mediastinum: Visualized mediastinum is unremarkable.  Bones: Reversal of normal cervical spine lordosis with moderate degenerative changes at C6-7.        Impression    FINDINGS CONSISTENT WITH A PHLEGMON/DEVELOPING ABSCESS IN THE RIGHT TONSILLAR REGION MEASURING UP TO 3.2 CM. THERE IS RIGHT-TO-LEFT MASS EFFECT UPON THE ADJACENT AIRWAY, WHICH REMAINS PATENT.      One or more dose reduction techniques were used (e.g., Automated exposure control, adjustment of the mA and/or kV according to patient size, use of iterative reconstruction technique).      Radiologist location ID: [id number]             Problem List:  There are no active hospital problems to display for this patient.      A/P:  Tonsillar abscess   Suspected sleep apnea   Obesity  Nicotine dependence, 1 pack per day smoker    Continue clindamycin  Decadron   Follow up on cultures   Clear liquid diet    Nicotine patch

## 2022-05-31 DIAGNOSIS — Z6841 Body Mass Index (BMI) 40.0 and over, adult: Secondary | ICD-10-CM

## 2022-05-31 LAB — BLOOD CULTURE ID GRAM POSITIVE
ENTEROCOCCUS FAECALIS: NOT DETECTED
ENTEROCOCCUS FAECIUM: NOT DETECTED
LISTERIA SPECIES: NOT DETECTED
STAPHYLOCOCCUS AUREUS: NOT DETECTED
STAPHYLOCOCCUS EPIDERMIDIS: NOT DETECTED
STAPHYLOCOCCUS LUGDUNENSIS: NOT DETECTED
STAPHYLOCOCCUS SPECIES: NOT DETECTED
STREPTOCOCCUS AGALACTIAE: NOT DETECTED
STREPTOCOCCUS ANGINOSUS: DETECTED — AB
STREPTOCOCCUS PNEUMONIAE: NOT DETECTED
STREPTOCOCCUS PYOGENES: NOT DETECTED
STREPTOCOCCUS SPECIES: DETECTED — AB

## 2022-05-31 LAB — CBC
HCT: 42.9 % (ref 36.7–47.1)
HGB: 14.1 g/dL (ref 12.5–16.3)
MCH: 29.6 pg (ref 23.8–33.4)
MCHC: 32.9 g/dL (ref 32.5–36.3)
MCV: 90.2 fL (ref 73.0–96.2)
MPV: 7.6 fL (ref 7.4–11.4)
PLATELETS: 216 10*3/uL (ref 140–440)
RBC: 4.75 10*6/uL (ref 4.06–5.63)
RDW: 13.6 % (ref 12.1–16.2)
WBC: 15.1 10*3/uL — ABNORMAL HIGH (ref 3.6–10.2)

## 2022-05-31 LAB — BASIC METABOLIC PANEL
ANION GAP: 4 mmol/L (ref 4–13)
BUN/CREA RATIO: 30 — ABNORMAL HIGH (ref 6–22)
BUN: 21 mg/dL (ref 7–25)
CALCIUM: 8.9 mg/dL (ref 8.6–10.3)
CHLORIDE: 106 mmol/L (ref 98–107)
CO2 TOTAL: 28 mmol/L (ref 21–31)
CREATININE: 0.7 mg/dL (ref 0.60–1.30)
ESTIMATED GFR: 113 mL/min/{1.73_m2} (ref >59)
GLUCOSE: 110 mg/dL — ABNORMAL HIGH (ref 74–109)
OSMOLALITY, CALCULATED: 279 mOsm/kg (ref 270–290)
POTASSIUM: 4.7 mmol/L (ref 3.5–5.1)
SODIUM: 138 mmol/L (ref 136–145)

## 2022-05-31 LAB — POC BLOOD GLUCOSE (RESULTS)
GLUCOSE, POC: 111 mg/dl — ABNORMAL HIGH (ref 70–100)
GLUCOSE, POC: 99 mg/dl (ref 70–100)
GLUCOSE, POC: 146 mg/dl — ABNORMAL HIGH (ref 70–100)
GLUCOSE, POC: 196 mg/dl — ABNORMAL HIGH (ref 70–100)

## 2022-05-31 LAB — MAGNESIUM: MAGNESIUM: 2.2 mg/dL (ref 1.9–2.7)

## 2022-05-31 LAB — THROAT CULTURE, BETA HEMOLYTIC STREPTOCOCCUS: THROAT CULTURE: NORMAL

## 2022-05-31 LAB — HGA1C (HEMOGLOBIN A1C WITH EST AVG GLUCOSE): HEMOGLOBIN A1C: 5.3 % (ref 4.0–6.0)

## 2022-05-31 MED ORDER — ETHYL ALCOHOL 62 % (NOZIN NASAL SANITIZER) NASAL SOLUTION - BULK BOTTLE
1.0000 | Freq: Two times a day (BID) | NASAL | Status: DC
Start: 2022-05-31 — End: 2022-06-02
  Administered 2022-05-31 – 2022-06-02 (×5): 1 via NASAL

## 2022-05-31 MED ORDER — PREDNISONE 10 MG TABLET
ORAL_TABLET | ORAL | 0 refills | Status: DC
Start: 2022-05-31 — End: 2022-06-02

## 2022-05-31 MED ORDER — NICOTINE 21 MG/24 HR DAILY TRANSDERMAL PATCH
21.0000 mg | MEDICATED_PATCH | Freq: Every day | TRANSDERMAL | Status: DC
Start: 2022-05-31 — End: 2022-06-02
  Administered 2022-05-31 – 2022-06-02 (×3): 21 mg via TRANSDERMAL
  Filled 2022-05-31 (×3): qty 1

## 2022-05-31 MED ORDER — CLINDAMYCIN HCL 300 MG CAPSULE
600.0000 mg | ORAL_CAPSULE | Freq: Three times a day (TID) | ORAL | 0 refills | Status: DC
Start: 2022-05-31 — End: 2022-06-02

## 2022-05-31 NOTE — Respiratory Therapy (Signed)
Upon Entrance into the room, I found patient on RA with a saturation of 93 at this time. I notified patient that he does receive PRN treatments and anytime throughout the night, if he feels like he is getting SOB, to have the RN to call me

## 2022-05-31 NOTE — Discharge Summary (Addendum)
[place] [facility]     DISCHARGE SUMMARY      PATIENT NAME:  Charles Mendoza   MRN:  [id number]  DOB:  Mar 18, 1970    INPATIENT ADMISSION DATE: 05/30/2022   DATE OF DISCHARGE:  05/31/22     ATTENDING PHYSICIAN: Maxine Glenn, FNP-BC     HOSPITAL PRESENTATION:  Please see full admission H&P for details.    As per HPI:  52 year old with no known medical history presented with to 3 day history throat discomfort. He has been having difficulty of recently eating and drinking. He does have a cough. Nonproductive. No shortness of breath. No fever, chills or sweats. He was noted to have a leukocytosis on laboratory data. A CT scan of his neck showed right tonsillar swelling. No other significant findings. He was started on Decadron as well as given clindamycin. He underwent laryngoscopy by ENT. No edema and/or compromise of his airway.     FURTHER HOSPITAL COURSE:  Patient was continued on clindamycin for antibiotic coverage. Nicotine patch ordered. Dr. Hulan Saas, ENT, evaluated patient and performed endoscope that showed left tonsillar edema, no laryngeal edema on 05/30/22. Did not feel any clinical signs of abscess. Patient has been cleared from ENT standpoint today for discharge on total of 10 days clindamycin and steroid taper. Would like patient to follow up in office in 1 week. Patient is feeling better. Patient diet to be advanced to soft diet. If able to tolerate then can likely be d/c home later today. Nursing also weaning oxygen, suspect underlying sleep apnea. Recommend patient to discuss outpatient sleep evaluation with PCP at follow up. Patient to follow up with PCP as well in 1 week.     Addendum: notified patient's blood cultures are positive in 1/2 bottles with gram positive cocci in pairs and chains. Will hold discharge today, repeat blood cultures, and await final results. Patient can be downgraded to telemetry floor to continue care.     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     There are no active non-hospital problems to display for this patient.    PHYSICAL EXAM DAY OF DISCHARGE:  BP (!) 133/90   Pulse 78   Temp 37.1 C (98.8 F)   Resp 15   Ht 1.651 m (5\' 5" )   Wt 133 kg (293 lb 3.4 oz)   SpO2 97%   BMI 48.79 kg/m      Physical Exam  Constitutional:       General: He is not in acute distress.     Appearance: He is obese.   Cardiovascular:      Rate and Rhythm: Normal rate and regular rhythm.   Pulmonary:      Effort: Pulmonary effort is normal. No respiratory distress.   Abdominal:      Palpations: Abdomen is soft.   Skin:     General: Skin is warm and dry.   Neurological:      General: No focal deficit present.      Mental Status: He is alert and oriented to person, place, and time.       LABS WITHIN LAST 24 HOURS:   Results for orders placed or performed during the  hospital encounter of 05/30/22 (from the past 24 hour(s))   RAPID THROAT SCREEN, STREPTOCOCCUS, WITH REFLEX    Specimen: Throat; Swab   Result Value Ref Range    THROAT RAPID SCREEN, STREPTOCOCCUS Negative Negative   COVID-19, FLU A/B, RSV RAPID BY PCR   Result Value Ref Range    SARS-CoV-2 Not Detected Not Detected    INFLUENZA VIRUS TYPE A Not Detected Not Detected    INFLUENZA VIRUS TYPE B Not Detected Not Detected    RESPIRATORY SYNCTIAL VIRUS (RSV) Not Detected Not Detected   THROAT CULTURE, BETA HEMOLYTIC STREPTOCOCCUS    Specimen: Throat; Swab   Result Value Ref Range    THROAT CULTURE Normal Commensal Flora    COMPREHENSIVE METABOLIC PANEL, NON-FASTING   Result Value Ref Range    SODIUM 137 136 - 145 mmol/L    POTASSIUM 4.1 3.5 - 5.1 mmol/L    CHLORIDE 103 98 - 107 mmol/L    CO2 TOTAL 26 21 - 31 mmol/L    ANION GAP 8 4 - 13 mmol/L    BUN 14 7 - 25 mg/dL     CREATININE 1.61 0.96 - 1.30 mg/dL    BUN/CREA RATIO 19 6 - 22    ESTIMATED GFR 111 >59 mL/min/1.58m^2    ALBUMIN 4.4 3.5 - 5.7 g/dL    CALCIUM 04.5 8.6 - 40.9 mg/dL    GLUCOSE 811 74 - 914 mg/dL    ALKALINE PHOSPHATASE 72 34 - 104 U/L    ALT (SGPT) 27 7 - 52 U/L    AST (SGOT) 24 13 - 39 U/L    BILIRUBIN TOTAL 1.1 0.3 - 1.2 mg/dL    PROTEIN TOTAL 7.6 6.4 - 8.9 g/dL    ALBUMIN/GLOBULIN RATIO 1.4 0.8 - 1.4    OSMOLALITY, CALCULATED 275 270 - 290 mOsm/kg    CALCIUM, CORRECTED 9.7 8.9 - 10.8 mg/dL    GLOBULIN 3.2 2.9 - 5.4   LACTIC ACID LEVEL W/ REFLEX FOR LEVEL >2.0   Result Value Ref Range    LACTIC ACID 0.7 0.5 - 2.2 mmol/L   MONO Test   Result Value Ref Range    MONONUCLEOSIS RAPID TEST Negative Negative   CBC WITH DIFF   Result Value Ref Range    WBC 16.3 (H) 3.6 - 10.2 x10^3/uL    RBC 4.75 4.06 - 5.63 x10^6/uL    HGB 14.0 12.5 - 16.3 g/dL    HCT 78.2 95.6 - 21.3 %    MCV 88.7 73.0 - 96.2 fL    MCH 29.6 23.8 - 33.4 pg    MCHC 33.3 32.5 - 36.3 g/dL    RDW 08.6 57.8 - 46.9 %    PLATELETS 212 140 - 440 x10^3/uL    MPV 7.5 7.4 - 11.4 fL    NEUTROPHIL % 87 (H) 43 - 77 %    LYMPHOCYTE % 6 (L) 16 - 44 %    MONOCYTE % 7 5 - 13 %    EOSINOPHIL % 0 (L) %    BASOPHIL % 0 0 - 1 %    NEUTROPHIL # 14.20 (H) 1.85 - 7.80 x10^3/uL    LYMPHOCYTE # 0.90 (L) 1.00 - 3.00 x10^3/uL    MONOCYTE # 1.10 (H) 0.30 - 1.00 x10^3/uL    EOSINOPHIL # 0.00 0.00 - 0.50 x10^3/uL    BASOPHIL # 0.00 0.00 - 0.10 x10^3/uL   BLUE TOP TUBE   Result Value Ref Range    RAINBOW/EXTRA TUBE AUTO RESULT Yes  POC BLOOD GLUCOSE (RESULTS)   Result Value Ref Range    GLUCOSE, POC 115 (H) 70 - 100 mg/dl   POC BLOOD GLUCOSE (RESULTS)   Result Value Ref Range    GLUCOSE, POC 160 (H) 70 - 100 mg/dl   CBC   Result Value Ref Range    WBC 15.1 (H) 3.6 - 10.2 x10^3/uL    RBC 4.75 4.06 - 5.63 x10^6/uL    HGB 14.1 12.5 - 16.3 g/dL    HCT 74.2 59.5 - 63.8 %    MCV 90.2 73.0 - 96.2 fL    MCH 29.6 23.8 - 33.4 pg    MCHC 32.9 32.5 - 36.3 g/dL    RDW 75.6 43.3 - 29.5 %     PLATELETS 216 140 - 440 x10^3/uL    MPV 7.6 7.4 - 11.4 fL   BASIC METABOLIC PANEL   Result Value Ref Range    SODIUM 138 136 - 145 mmol/L    POTASSIUM 4.7 3.5 - 5.1 mmol/L    CHLORIDE 106 98 - 107 mmol/L    CO2 TOTAL 28 21 - 31 mmol/L    ANION GAP 4 4 - 13 mmol/L    CALCIUM 8.9 8.6 - 10.3 mg/dL    GLUCOSE 188 (H) 74 - 109 mg/dL    BUN 21 7 - 25 mg/dL    CREATININE 4.16 6.06 - 1.30 mg/dL    BUN/CREA RATIO 30 (H) 6 - 22    ESTIMATED GFR 113 >59 mL/min/1.1m^2    OSMOLALITY, CALCULATED 279 270 - 290 mOsm/kg   MAGNESIUM   Result Value Ref Range    MAGNESIUM 2.2 1.9 - 2.7 mg/dL   TKZ6W (HEMOGLOBIN F0X WITH EST AVG GLUCOSE)   Result Value Ref Range    HEMOGLOBIN A1C 5.3 4.0 - 6.0 %   POC BLOOD GLUCOSE (RESULTS)   Result Value Ref Range    GLUCOSE, POC 111 (H) 70 - 100 mg/dl        IMAGING WITHIN LAST 24 HOURS:   No results found.  CT SOFT TISSUE NECK W IV CONTRAST   Final Result   FINDINGS CONSISTENT WITH A PHLEGMON/DEVELOPING ABSCESS IN THE RIGHT TONSILLAR REGION MEASURING UP TO 3.2 CM. THERE IS RIGHT-TO-LEFT MASS EFFECT UPON THE ADJACENT AIRWAY, WHICH REMAINS PATENT.         One or more dose reduction techniques were used (e.g., Automated exposure control, adjustment of the mA and/or kV according to patient size, use of iterative reconstruction technique).         Radiologist location ID: [id number]              MICROBIOLOGY WITHIN LAST 24 HOURS:   Hospital Encounter on 05/30/22 (from the past 24 hour(s))   RAPID THROAT SCREEN, STREPTOCOCCUS, WITH REFLEX    Collection Time: 05/30/22 11:33 AM    Specimen: Throat; Swab   Culture Result Status    THROAT RAPID SCREEN, STREPTOCOCCUS Negative Final    Narrative    Walk-Away Mode   THROAT CULTURE, BETA HEMOLYTIC STREPTOCOCCUS    Collection Time: 05/30/22 11:33 AM    Specimen: Throat; Swab   Culture Result Status    THROAT CULTURE Normal Commensal Flora Preliminary        DISCHARGE MEDICATIONS:     Current Discharge Medication List        START taking these medications.         Details   clindamycin 300 mg Capsule  Commonly known as: CLEOCIN   600  mg, Oral, 3 TIMES DAILY  Qty: 54 Capsule  Refills: 0     predniSONE 10 mg Tablet  Commonly known as: DELTASONE  Start taking on: May 31, 2022   Take 4 Tablets (40 mg total) by mouth Once a day for 3 days, THEN 2 Tablets (20 mg total) Once a day for 3 days, THEN 1 Tablet (10 mg total) Once a day for 3 days.  Qty: 21 Tablet  Refills: 0             DISCHARGE DISPOSITION:  home with family.     DISCHARGE INSTRUCTIONS:  No discharge procedures on file.   Follow-up Information       Weitzel, Loraine Leriche, DO Follow up in 1 week(s).    Specialty: OTOLARYNGOLOGY  Why: hospital follow up  Contact information:  [address]  [address]  [place] [address]  [phone]      Mariah Milling, DO Follow up in 1 week(s).    Specialty: FAMILY MEDICINE  Why: hospital follow up  Contact information:  [address]  [place] [address]  [phone]         Copies sent to Care Team         Relationship Specialty Notifications Start End    Mariah Milling, DO PCP - General FAMILY MEDICINE  04/03/21     Phone: [phone] Fax: [phone]         [address]  [place] [address]          The hospitalist examined patient, reviewed material, and agreed with discharge at this time.   >30 minutes total were spent coordinating discharge day today    Maxine Glenn, FNP-BC  [facility] MEDICINE HOSPITALIST

## 2022-05-31 NOTE — Nurses Notes (Signed)
Report called to 4E at this time.

## 2022-05-31 NOTE — Nurses Notes (Signed)
Patient accepted to room 431B from ICU

## 2022-05-31 NOTE — Care Plan (Signed)
[facility]  Care Plan Note  Patient remains in ICU 7 for tonsillar abscess. Medicated x 1 for pain with Toradol and patient stated it helped well. Patient stated pain has completely subsided. Swelling appears to have subsided as well. Patient does appear to have OSA as he desats frequently while asleep so I placed an OM on him at 4L and he tolerated well while asleep. Patient ambulated to the bathroom with assistance twice to void. Patient is lethargic and difficult to arouse at times but is alert and oriented x4. Currently on clindamycin and decadron. Labs drawn as ordered and treated as indicated. Patient education as well as transitional readiness ongoing.     Betsy Pries, RN    Problem: Adult Inpatient Plan of Care  Goal: Plan of Care Review  Outcome: Ongoing (see interventions/notes)  Goal: Patient-Specific Goal (Individualized)  Outcome: Ongoing (see interventions/notes)  Flowsheets  Taken 05/31/2022 0400  Patient would like to participate in bedside shift report: Yes  Taken 05/31/2022 0000  Patient would like to participate in bedside shift report: Yes  Taken 05/30/2022 2000  Patient would like to participate in bedside shift report: Yes  Individualized Care Needs: monitor for signs of increasing airway obstruction  Anxieties, Fears or Concerns: none voiced  Patient-Specific Goals (Include Timeframe): meds as ordered  Plan of Care Reviewed With: patient  Goal: Absence of Hospital-Acquired Illness or Injury  Outcome: Ongoing (see interventions/notes)  Intervention: Identify and Manage Fall Risk  Recent Flowsheet Documentation  Taken 05/31/2022 0400 by Betsy Pries, RN  Safety Promotion/Fall Prevention:   activity supervised   fall prevention program maintained   muscle strengthening facilitated   motion sensor pad activated   nonskid shoes/slippers when out of bed   safety round/check completed  Taken 05/31/2022 0000 by Betsy Pries, RN  Safety Promotion/Fall Prevention:   activity supervised    fall prevention program maintained   muscle strengthening facilitated   motion sensor pad activated   nonskid shoes/slippers when out of bed   safety round/check completed  Taken 05/30/2022 2000 by Betsy Pries, RN  Safety Promotion/Fall Prevention:   activity supervised   fall prevention program maintained   muscle strengthening facilitated   motion sensor pad activated   nonskid shoes/slippers when out of bed   safety round/check completed  Intervention: Prevent Skin Injury  Recent Flowsheet Documentation  Taken 05/31/2022 0400 by Betsy Pries, RN  Body Position:   supine, head elevated   heels elevated off mattress  Taken 05/31/2022 0000 by Betsy Pries, RN  Body Position: supine, head elevated  Taken 05/30/2022 2000 by Betsy Pries, RN  Body Position: supine, head elevated  Intervention: Prevent and Manage VTE (Venous Thromboembolism) Risk  Recent Flowsheet Documentation  Taken 05/31/2022 0400 by Betsy Pries, RN  VTE Prevention/Management: anticoagulant therapy maintained  Taken 05/31/2022 0000 by Betsy Pries, RN  VTE Prevention/Management: anticoagulant therapy maintained  Taken 05/30/2022 2000 by Betsy Pries, RN  VTE Prevention/Management: anticoagulant therapy maintained  Intervention: Prevent Infection  Recent Flowsheet Documentation  Taken 05/31/2022 0000 by Betsy Pries, RN  Infection Prevention:   rest/sleep promoted   promote handwashing   personal protective equipment utilized  Taken 05/30/2022 2000 by Betsy Pries, RN  Infection Prevention:   rest/sleep promoted   promote handwashing   personal protective equipment utilized  Goal: Optimal Comfort and Wellbeing  Outcome: Ongoing (see interventions/notes)  Intervention: Provide Person-Centered Care  Recent Flowsheet Documentation  Taken 05/31/2022 0400 by Betsy Pries, RN  Trust Relationship/Rapport:  care explained   choices provided   emotional support provided   empathic listening provided   questions answered   questions encouraged   thoughts/feelings  acknowledged   reassurance provided  Taken 05/31/2022 0000 by Betsy Pries, RN  Trust Relationship/Rapport:   care explained   choices provided   emotional support provided   empathic listening provided   questions answered   questions encouraged   thoughts/feelings acknowledged   reassurance provided  Taken 05/30/2022 2000 by Betsy Pries, RN  Trust Relationship/Rapport:   care explained   choices provided   emotional support provided   empathic listening provided   questions answered   questions encouraged   thoughts/feelings acknowledged   reassurance provided  Goal: Rounds/Family Conference  Outcome: Ongoing (see interventions/notes)

## 2022-05-31 NOTE — Consults (Signed)
[facility]  Medicine Consult  Follow Up Note    Charles Mendoza, Charles Mendoza, 52 y.o. male  Encounter Start Date: 05/30/2022  Inpatient Admission Date: 05/30/2022  Date of Service: 05/31/2022  Date of Birth:  11/03/1970    Hospital Day:  LOS: 1 day     Chief Complaint:  Odynophagia  Subjective:   Patient states he is feeling much better. Denies any odynophagia , dyspnea or neck pain.      Current Facility-Administered Medications   Medication Dose Route Frequency Provider Last Rate Last Admin    acetaminophen (TYLENOL) tablet  650 mg Oral Q4H PRN Hutchinson, Roe Rutherford, FNP-BC        clindamycin (CLEOCIN) 600 mg in NS 50 mL premix IVPB  600 mg Intravenous Q8H Stanford Breed, FNP-BC   Stopped at 05/31/22 0981    Correction/SSIP insulin lispro (HumaLOG) 100 units/mL injection  0-18 Units Subcutaneous 4x/day AC Stanford Breed, FNP-BC        dexAMETHasone (PF) 10 mg/mL injection  10 mg Intravenous Daily Hutchinson, Roe Rutherford, FNP-BC        dextrose (GLUTOSE) 40% oral gel  15 g Oral Q15 Min PRN Hutchinson, Roe Rutherford, FNP-BC        dextrose 50% (0.5 g/mL) injection - syringe  12.5 g Intravenous Q15 Min PRN Stanford Breed, FNP-BC        enoxaparin PF (LOVENOX) 40 mg/0.4 mL SubQ injection  40 mg Subcutaneous Q24H Hutchinson, Angela Nicole, FNP-BC   40 mg at 05/30/22 1728    glucagon (GLUCAGEN DIAGNOSTIC KIT) injection 1 mg  1 mg IntraMUSCULAR Once PRN Stanford Breed, FNP-BC        ipratropium-albuterol 0.5 mg-3 mg(2.5 mg base)/3 mL Solution for Nebulization  3 mL Nebulization Q4H PRN Hutchinson, Roe Rutherford, FNP-BC        ketorolac (TORADOL) 30 mg/mL injection  30 mg Intravenous Q6H PRN Stanford Breed, FNP-BC   30 mg at 05/30/22 2110    NS flush syringe  3 mL Intracatheter Q8HRS Stanford Breed, FNP-BC   3 mL at 05/31/22 1914    NS flush syringe  3 mL Intracatheter Q1H PRN Stanford Breed, FNP-BC        NS premix infusion   Intravenous  Continuous Stanford Breed, FNP-BC 100 mL/hr at 05/31/22 0549 Rate Verify at 05/31/22 0549    ondansetron (ZOFRAN) 2 mg/mL injection  4 mg Intravenous Q6H PRN Stanford Breed, FNP-BC        prochlorperazine (COMPAZINE) 5 mg/mL injection  10 mg Intravenous Q6H PRN Hutchinson, Roe Rutherford, FNP-BC           Objective:  Temperature: 37.1 C (98.8 F)  Heart Rate: 78  BP (Non-Invasive): (!) 133/90  Respiratory Rate: 15  SpO2: 97 %  Physical Exam  Constitutional:       Appearance: Normal appearance.   HENT:      Head: Normocephalic and atraumatic.      Right Ear: Tympanic membrane normal.      Left Ear: Tympanic membrane normal.      Nose: Nose normal.      Mouth/Throat:      Mouth: Mucous membranes are dry.      Comments: +1 symmetric tonsils, uvula midline, no soft palate edema  Neurological:      Mental Status: He is alert.         Labs  Please indicate ordered or reviewed)  Reviewed: Lab Results Today:  Results for orders placed or performed during the hospital encounter of 05/30/22 (from the past 24 hour(s))   RAPID THROAT SCREEN, STREPTOCOCCUS, WITH REFLEX    Specimen: Throat; Swab   Result Value Ref Range    THROAT RAPID SCREEN, STREPTOCOCCUS Negative Negative   COVID-19, FLU A/B, RSV RAPID BY PCR   Result Value Ref Range    SARS-CoV-2 Not Detected Not Detected    INFLUENZA VIRUS TYPE A Not Detected Not Detected    INFLUENZA VIRUS TYPE B Not Detected Not Detected    RESPIRATORY SYNCTIAL VIRUS (RSV) Not Detected Not Detected   THROAT CULTURE, BETA HEMOLYTIC STREPTOCOCCUS    Specimen: Throat; Swab   Result Value Ref Range    THROAT CULTURE Normal Commensal Flora    COMPREHENSIVE METABOLIC PANEL, NON-FASTING   Result Value Ref Range    SODIUM 137 136 - 145 mmol/L    POTASSIUM 4.1 3.5 - 5.1 mmol/L    CHLORIDE 103 98 - 107 mmol/L    CO2 TOTAL 26 21 - 31 mmol/L    ANION GAP 8 4 - 13 mmol/L    BUN 14 7 - 25 mg/dL    CREATININE 1.61 0.96 - 1.30 mg/dL    BUN/CREA RATIO 19 6 - 22    ESTIMATED GFR 111  >59 mL/min/1.57m^2    ALBUMIN 4.4 3.5 - 5.7 g/dL    CALCIUM 04.5 8.6 - 40.9 mg/dL    GLUCOSE 811 74 - 914 mg/dL    ALKALINE PHOSPHATASE 72 34 - 104 U/L    ALT (SGPT) 27 7 - 52 U/L    AST (SGOT) 24 13 - 39 U/L    BILIRUBIN TOTAL 1.1 0.3 - 1.2 mg/dL    PROTEIN TOTAL 7.6 6.4 - 8.9 g/dL    ALBUMIN/GLOBULIN RATIO 1.4 0.8 - 1.4    OSMOLALITY, CALCULATED 275 270 - 290 mOsm/kg    CALCIUM, CORRECTED 9.7 8.9 - 10.8 mg/dL    GLOBULIN 3.2 2.9 - 5.4   LACTIC ACID LEVEL W/ REFLEX FOR LEVEL >2.0   Result Value Ref Range    LACTIC ACID 0.7 0.5 - 2.2 mmol/L   MONO Test   Result Value Ref Range    MONONUCLEOSIS RAPID TEST Negative Negative   CBC WITH DIFF   Result Value Ref Range    WBC 16.3 (H) 3.6 - 10.2 x10^3/uL    RBC 4.75 4.06 - 5.63 x10^6/uL    HGB 14.0 12.5 - 16.3 g/dL    HCT 78.2 95.6 - 21.3 %    MCV 88.7 73.0 - 96.2 fL    MCH 29.6 23.8 - 33.4 pg    MCHC 33.3 32.5 - 36.3 g/dL    RDW 08.6 57.8 - 46.9 %    PLATELETS 212 140 - 440 x10^3/uL    MPV 7.5 7.4 - 11.4 fL    NEUTROPHIL % 87 (H) 43 - 77 %    LYMPHOCYTE % 6 (L) 16 - 44 %    MONOCYTE % 7 5 - 13 %    EOSINOPHIL % 0 (L) %    BASOPHIL % 0 0 - 1 %    NEUTROPHIL # 14.20 (H) 1.85 - 7.80 x10^3/uL    LYMPHOCYTE # 0.90 (L) 1.00 - 3.00 x10^3/uL    MONOCYTE # 1.10 (H) 0.30 - 1.00 x10^3/uL    EOSINOPHIL # 0.00 0.00 - 0.50 x10^3/uL    BASOPHIL # 0.00 0.00 - 0.10 x10^3/uL   BLUE TOP TUBE   Result Value Ref Range  RAINBOW/EXTRA TUBE AUTO RESULT Yes    POC BLOOD GLUCOSE (RESULTS)   Result Value Ref Range    GLUCOSE, POC 115 (H) 70 - 100 mg/dl   POC BLOOD GLUCOSE (RESULTS)   Result Value Ref Range    GLUCOSE, POC 160 (H) 70 - 100 mg/dl   CBC   Result Value Ref Range    WBC 15.1 (H) 3.6 - 10.2 x10^3/uL    RBC 4.75 4.06 - 5.63 x10^6/uL    HGB 14.1 12.5 - 16.3 g/dL    HCT 04.5 40.9 - 81.1 %    MCV 90.2 73.0 - 96.2 fL    MCH 29.6 23.8 - 33.4 pg    MCHC 32.9 32.5 - 36.3 g/dL    RDW 91.4 78.2 - 95.6 %    PLATELETS 216 140 - 440 x10^3/uL    MPV 7.6 7.4 - 11.4 fL   BASIC METABOLIC PANEL   Result  Value Ref Range    SODIUM 138 136 - 145 mmol/L    POTASSIUM 4.7 3.5 - 5.1 mmol/L    CHLORIDE 106 98 - 107 mmol/L    CO2 TOTAL 28 21 - 31 mmol/L    ANION GAP 4 4 - 13 mmol/L    CALCIUM 8.9 8.6 - 10.3 mg/dL    GLUCOSE 213 (H) 74 - 109 mg/dL    BUN 21 7 - 25 mg/dL    CREATININE 0.86 5.78 - 1.30 mg/dL    BUN/CREA RATIO 30 (H) 6 - 22    ESTIMATED GFR 113 >59 mL/min/1.33m^2    OSMOLALITY, CALCULATED 279 270 - 290 mOsm/kg   MAGNESIUM   Result Value Ref Range    MAGNESIUM 2.2 1.9 - 2.7 mg/dL   ION6E (HEMOGLOBIN X5M WITH EST AVG GLUCOSE)   Result Value Ref Range    HEMOGLOBIN A1C 5.3 4.0 - 6.0 %   POC BLOOD GLUCOSE (RESULTS)   Result Value Ref Range    GLUCOSE, POC 111 (H) 70 - 100 mg/dl         Radiology Tests (Please indicate ordered or reviewed)  Reviewed: No results found.       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 Recommend steroid taper and clindamycin x 10 days. He may follow up as outpatient in 1 week.     Conchita Paris, DO

## 2022-06-01 DIAGNOSIS — B955 Unspecified streptococcus as the cause of diseases classified elsewhere: Secondary | ICD-10-CM

## 2022-06-01 DIAGNOSIS — R7881 Bacteremia: Secondary | ICD-10-CM

## 2022-06-01 DIAGNOSIS — Z87891 Personal history of nicotine dependence: Secondary | ICD-10-CM

## 2022-06-01 LAB — BASIC METABOLIC PANEL
ANION GAP: 6 mmol/L (ref 4–13)
BUN/CREA RATIO: 37 — ABNORMAL HIGH (ref 6–22)
BUN: 26 mg/dL — ABNORMAL HIGH (ref 7–25)
CALCIUM: 9 mg/dL (ref 8.6–10.3)
CHLORIDE: 109 mmol/L — ABNORMAL HIGH (ref 98–107)
CO2 TOTAL: 25 mmol/L (ref 21–31)
CREATININE: 0.71 mg/dL (ref 0.60–1.30)
ESTIMATED GFR: 112 mL/min/{1.73_m2} (ref >59)
GLUCOSE: 123 mg/dL — ABNORMAL HIGH (ref 74–109)
OSMOLALITY, CALCULATED: 286 mOsm/kg (ref 270–290)
POTASSIUM: 4.2 mmol/L (ref 3.5–5.1)
SODIUM: 140 mmol/L (ref 136–145)

## 2022-06-01 LAB — CBC
HCT: 38.8 % (ref 36.7–47.1)
HGB: 12.8 g/dL (ref 12.5–16.3)
MCH: 29.8 pg (ref 23.8–33.4)
MCHC: 33.1 g/dL (ref 32.5–36.3)
MCV: 90 fL (ref 73.0–96.2)
MPV: 7.5 fL (ref 7.4–11.4)
PLATELETS: 206 10*3/uL (ref 140–440)
RBC: 4.31 10*6/uL (ref 4.06–5.63)
RDW: 13.7 % (ref 12.1–16.2)
WBC: 15.6 10*3/uL — ABNORMAL HIGH (ref 3.6–10.2)

## 2022-06-01 LAB — MAGNESIUM: MAGNESIUM: 2 mg/dL (ref 1.9–2.7)

## 2022-06-01 LAB — ADULT ROUTINE BLOOD CULTURE, SET OF 2 BOTTLES (BACTERIA AND YEAST): BLOOD CULTURE, ROUTINE: ABNORMAL — CR

## 2022-06-01 LAB — POC BLOOD GLUCOSE (RESULTS)
GLUCOSE, POC: 88 mg/dl (ref 70–100)
GLUCOSE, POC: 102 mg/dl — ABNORMAL HIGH (ref 70–100)
GLUCOSE, POC: 158 mg/dl — ABNORMAL HIGH (ref 70–100)
GLUCOSE, POC: 195 mg/dl — ABNORMAL HIGH (ref 70–100)

## 2022-06-01 MED ORDER — AMOXICILLIN 875 MG-POTASSIUM CLAVULANATE 125 MG TABLET
1.0000 | ORAL_TABLET | Freq: Two times a day (BID) | ORAL | Status: DC
Start: 2022-06-01 — End: 2022-06-02
  Administered 2022-06-01 – 2022-06-02 (×2): 1 via ORAL
  Filled 2022-06-01 (×2): qty 1

## 2022-06-01 NOTE — Care Plan (Signed)
Problem: Adult Inpatient Plan of Care  Goal: Plan of Care Review  Outcome: Ongoing (see interventions/notes)  Goal: Patient-Specific Goal (Individualized)  Outcome: Ongoing (see interventions/notes)  Flowsheets (Taken 06/01/2022 0900)  Individualized Care Needs: monitor oxygen  Anxieties, Fears or Concerns: none voiced at this time  Patient-Specific Goals (Include Timeframe): dc when appropraite  Goal: Absence of Hospital-Acquired Illness or Injury  Outcome: Ongoing (see interventions/notes)  Goal: Optimal Comfort and Wellbeing  Outcome: Ongoing (see interventions/notes)  Goal: Rounds/Family Conference  Outcome: Ongoing (see interventions/notes)

## 2022-06-01 NOTE — Progress Notes (Signed)
[facility]   Progress Note    Charles Mendoza  Date of service: 06/01/2022  Date of Admission:  05/30/2022  Hospital Day:  LOS: 2 days     Subjective:  No overnight events reported.  The patient denies any chest pain, palpitations, shortness on breath.  He denied any subjective fevers/chills and has been able to tolerate p.o. medications/nutrition without issue.  No additional issues noted.    Review of Systems:    As mentioned above.     Objective:      Vital Signs:  Vitals:    06/01/22 0509 06/01/22 0740 06/01/22 1109 06/01/22 1158   BP: (!) 142/75 129/71  133/70   Pulse: 85 79 70 85   Resp: 20 20  20    Temp: 36.1 C (96.9 F) 36.3 C (97.3 F)  36.3 C (97.4 F)   SpO2: 95% 95%  94%   Weight:       Height:       BMI:                I/O:  I/O last 24 hours:    Intake/Output Summary (Last 24 hours) at 06/01/2022 1436  Last data filed at 06/01/2022 1011  Gross per 24 hour   Intake 370 ml   Output 300 ml   Net 70 ml         acetaminophen (TYLENOL) tablet, 650 mg, Oral, Q4H PRN  alcohol 62 % (NOZIN NASAL SANITIZER) nasal solution, 1 Each, Each Nostril, 2x/day  clindamycin (CLEOCIN) 600 mg in NS 50 mL premix IVPB, 600 mg, Intravenous, Q8H  Correction/SSIP insulin lispro (HumaLOG) 100 units/mL injection, 0-18 Units, Subcutaneous, 4x/day AC  dexAMETHasone (PF) 10 mg/mL injection, 10 mg, Intravenous, Daily  dextrose (GLUTOSE) 40% oral gel, 15 g, Oral, Q15 Min PRN  dextrose 50% (0.5 g/mL) injection - syringe, 12.5 g, Intravenous, Q15 Min PRN  enoxaparin PF (LOVENOX) 40 mg/0.4 mL SubQ injection, 40 mg, Subcutaneous, Q24H  glucagon (GLUCAGEN DIAGNOSTIC KIT) injection 1 mg, 1 mg, IntraMUSCULAR, Once PRN  ipratropium-albuterol 0.5 mg-3 mg(2.5 mg base)/3 mL Solution for Nebulization, 3 mL, Nebulization, Q4H PRN  ketorolac (TORADOL) 30 mg/mL injection, 30 mg, Intravenous, Q6H PRN  nicotine (NICODERM CQ) transdermal patch (mg/24 hr), 21 mg, Transdermal, Daily  NS flush syringe, 3 mL, Intracatheter, Q8HRS  NS  flush syringe, 3 mL, Intracatheter, Q1H PRN  NS premix infusion, , Intravenous, Continuous  ondansetron (ZOFRAN) 2 mg/mL injection, 4 mg, Intravenous, Q6H PRN  prochlorperazine (COMPAZINE) 5 mg/mL injection, 10 mg, Intravenous, Q6H PRN        Physical Exam:    Gen: NAD, pleasant, alert/oriented x 3, speaking in full sentences  HEENT:  Atraumatic, moist mucous membranes  Neck: supple, no TM  Heart:  Normal S1/S2, no extra heart sounds noted  Chest:  Clear to auscultation bilaterally, good aeration to bases  Abdomen:+BS, soft,NT/ND, no rebound/guarding, normal contour  Extr: no c/c/e  Neurological:CN II-XII grossly normal, no focal deficits  Skin: Skin is warm and dry, no erythema noted    Labs:  Results for orders placed or performed during the hospital encounter of 05/30/22 (from the past 24 hour(s))   CBC   Result Value Ref Range    WBC 15.6 (H) 3.6 - 10.2 x10^3/uL    RBC 4.31 4.06 - 5.63 x10^6/uL    HGB 12.8 12.5 - 16.3 g/dL    HCT 16.1 09.6 - 04.5 %    MCV 90.0 73.0 - 96.2 fL  MCH 29.8 23.8 - 33.4 pg    MCHC 33.1 32.5 - 36.3 g/dL    RDW 16.1 09.6 - 04.5 %    PLATELETS 206 140 - 440 x10^3/uL    MPV 7.5 7.4 - 11.4 fL    Narrative    [id number] ON [id number] IN 0001/5   BASIC METABOLIC PANEL   Result Value Ref Range    SODIUM 140 136 - 145 mmol/L    POTASSIUM 4.2 3.5 - 5.1 mmol/L    CHLORIDE 109 (H) 98 - 107 mmol/L    CO2 TOTAL 25 21 - 31 mmol/L    ANION GAP 6 4 - 13 mmol/L    CALCIUM 9.0 8.6 - 10.3 mg/dL    GLUCOSE 829 (H) 74 - 109 mg/dL    BUN 26 (H) 7 - 25 mg/dL    CREATININE 5.62 1.30 - 1.30 mg/dL    BUN/CREA RATIO 37 (H) 6 - 22    ESTIMATED GFR 112 >59 mL/min/1.64m^2    OSMOLALITY, CALCULATED 286 270 - 290 mOsm/kg    Narrative    Estimated Glomerular Filtration Rate (eGFR) is calculated using the CKD-EPI (2021) equation, intended for patients 65 years of age and older. If gender is not documented or "unknown", there will be no eGFR calculation.     MAGNESIUM   Result Value Ref Range    MAGNESIUM 2.0 1.9 - 2.7  mg/dL   POC BLOOD GLUCOSE (RESULTS)   Result Value Ref Range    GLUCOSE, POC 146 (H) 70 - 100 mg/dl   POC BLOOD GLUCOSE (RESULTS)   Result Value Ref Range    GLUCOSE, POC 196 (H) 70 - 100 mg/dl   POC BLOOD GLUCOSE (RESULTS)   Result Value Ref Range    GLUCOSE, POC 88 70 - 100 mg/dl   POC BLOOD GLUCOSE (RESULTS)   Result Value Ref Range    GLUCOSE, POC 102 (H) 70 - 100 mg/dl        Imaging:           Microbiology:  Hospital Encounter on 05/30/22 (from the past 96 hour(s))   RAPID THROAT SCREEN, STREPTOCOCCUS, WITH REFLEX    Collection Time: 05/30/22 11:33 AM    Specimen: Throat; Swab   Culture Result Status    THROAT RAPID SCREEN, STREPTOCOCCUS Negative Final    Narrative    Walk-Away Mode   THROAT CULTURE, BETA HEMOLYTIC STREPTOCOCCUS    Collection Time: 05/30/22 11:33 AM    Specimen: Throat; Swab   Culture Result Status    THROAT CULTURE Normal Commensal Flora Final   ADULT ROUTINE BLOOD CULTURE, SET OF 2 ADULT BOTTLES (BACTERIA AND YEAST)    Collection Time: 05/30/22  1:48 PM    Specimen: Blood   Culture Result Status    BLOOD CULTURE, ROUTINE Abnormal Stain (AA) Final    BLOOD CULTURE, ROUTINE Streptococcus constellatus (A) Final    GRAM STAIN Gram Positive Cocci/Pairs and Chains (A) Final    Narrative    GROWTH IN 2 OF 2 BOTTLES     BLOOD CULTURE ID GRAM POSITIVE    Collection Time: 05/30/22  1:48 PM    Specimen: Blood   Culture Result Status    STREPTOCOCCUS SPECIES Detected (A) Final    STAPHYLOCOCCUS AUREUS Not Detected Final    STAPHYLOCOCCUS EPIDERMIDIS Not Detected Final    STAPHYLOCOCCUS LUGDUNENSIS Not Detected Final    STREPTOCOCCUS ANGINOSUS Detected (A) Final    STREPTOCOCCUS AGALACTIAE Not Detected Final    STREPTOCOCCUS PYOGENES  Not Detected Final    STREPTOCOCCUS PNEUMONIAE Not Detected Final    ENTEROCOCCUS FAECALIS Not Detected Final    ENTEROCOCCUS FAECIUM Not Detected Final    STAPHYLOCOCCUS SPECIES Not Detected Final    LISTERIA SPECIES Not Detected Final    BACTERIAL METHICILLIN RESISTANCE  (MECA) GENE N/A Final    BACTERIAL VANCOMYCIN RESISTANCE (VANA) GENE N/A Final    BACTERIAL VANCOMYCIN RESISTANCE (VANB) GENE N/A Final           Assessment/ Plan:   Acute tonsillitis  Tonsillar abscess, present on admission  -afebrile, HD stable, WBC 15.6  -follow-up repeat blood culture results and tailor antibiotics accordingly  -continue current antibiotic regimen for now  -monitor closely     Bacteremia  -blood cultures show Streptococcus constellatus sp  -transthoracic echocardiogram to follow  -ID consult pending  -continue current antibiotic regimen    Tobacco Dependence  ->10 min cessation counseling provided  -nicotine replacement Rx to continue  -continue to encourage cessation     Deconditioned State  -PT as per schedule  -monitor pulse    Code Status:  Full code    Diet:  Regular    DVT Prophylaxis   -SCDs/Lovenox    Disposition:  Pending clinical course    I have spent 55 minutes care for this patient with over half in discussion of the diagnosis and the importance of compliance with the treatment plan.  The remainder of the time was spent examining the patient and discussing the plan of care patient's family/care team    Lalla Brothers, MD    This note was partially generated using MModal Fluency Direct system, and there may be some incorrect words, spellings, and punctuation that were not noted in checking the note before saving.

## 2022-06-01 NOTE — Consults (Signed)
[facility]/ID eConsult Initial Note    Reason for Consult: Tonsillar abscess    Pertinent Clinical History: [name] is a 52 year-old male with medical history including obesity (BMI 47 kg/m^2) and tobacco use disorder admitted to [facility] since 05/30/2022 with a right peritonsillar abscess and Streptococcus constellatus bacteremia.  He presented with throat discomfort, difficulty eating and drinking, and nonproductive cough.  He has been afebrile.  Blood cultures X 1 set on 05/30/2022 grew Streptococcus constellatus in 2 of 2 bottles with blood cultures X 1 set on 06/01/2022 pending.  CT soft tissue neck on 05/30/2022 showed a phlegmon/developing abscess in the right tonsillar region measuring up to 3.2 cm with right to left mass effect on the adjacent airway that has remained patent.  He underwent flexible laryngoscopy by Otolaryngology on 05/30/2022 that showed tonsillar edema and patent airway.  Per documentation by Otolaryngology on 05/30/2040, the patient had improved with resolution of tonsillar edema/no clinical signs of abscess.  He has also been noted to be tolerating oral medications and nutrition.  He received a dose of ceftriaxone on 05/30/2022 followed by clindamycin IV (current regimen) from 05/30/2022.  He has been receiving dexamethasone.    PMHx: Obesity, tobacco use disorder    Allergies: No known allergies    Labs, Micro, and Imaging Reviewed:  05/30/2022 Total bilirubin 1.1 mg/dL, AST 24 U/L, ALT 27 U/L, alkaline phosphatase 72 U/L  05/31/2022 HbA1c 5.3%  06/01/2022  WBC 15,600 (maximum 16,300 on 05/30/2022), hemoglobin 12.8 g/dL, hematocrit 16.1%, platelets 206,000, BUN 26 mg/dL, creatinine 0.96 mg/dL    0/45/4098 Rapid throat screen for Streptococcus: Negative  05/30/2022 Throat culture for beta-hemolytic Streptococcus: Normal commensal flora  05/30/2022 Blood cultures X 1 set: Streptococcus constellatus in 2 of 2 bottles  06/01/2022 Blood cultures X 1 set: No growth    05/30/2022 CT  soft tissue neck with IV contrast: Phlegmon/developing abscess in the right tonsillar region measuring up to 3.2 cm with right to left mass effect on the adjacent airway that has remained patent    Assessment/Recommendations:  Right peritonsillar abscess with Streptococcus constellatus bacteremia  1. Please start amoxicillin-clavulanate 875-125 mg PO BID and continue to complete 14 days of this regimen (end date 06/15/2022).  2. Please discontinue clindamycin.    I spent 5 minutes or more reviewing the patient's medical record, lab/micro/imaging studies, and/or medications.  Communicated with provider through Secure Chat/In [employer].  Total time 35 min.     Levin Erp, M.D., 06/01/2022  [facility] Medical Director and Assistant Professor  Division of Infectious Diseases  [place] [facility]

## 2022-06-02 ENCOUNTER — Inpatient Hospital Stay (HOSPITAL_COMMUNITY): Payer: No Typology Code available for payment source

## 2022-06-02 ENCOUNTER — Other Ambulatory Visit: Payer: Self-pay

## 2022-06-02 DIAGNOSIS — B955 Unspecified streptococcus as the cause of diseases classified elsewhere: Secondary | ICD-10-CM

## 2022-06-02 LAB — BASIC METABOLIC PANEL
ANION GAP: 5 mmol/L (ref 4–13)
BUN/CREA RATIO: 30 — ABNORMAL HIGH (ref 6–22)
BUN: 22 mg/dL (ref 7–25)
CALCIUM: 8.8 mg/dL (ref 8.6–10.3)
CHLORIDE: 108 mmol/L — ABNORMAL HIGH (ref 98–107)
CO2 TOTAL: 26 mmol/L (ref 21–31)
CREATININE: 0.73 mg/dL (ref 0.60–1.30)
ESTIMATED GFR: 112 mL/min/{1.73_m2} (ref >59)
GLUCOSE: 115 mg/dL — ABNORMAL HIGH (ref 74–109)
OSMOLALITY, CALCULATED: 282 mOsm/kg (ref 270–290)
POTASSIUM: 4.2 mmol/L (ref 3.5–5.1)
SODIUM: 139 mmol/L (ref 136–145)

## 2022-06-02 LAB — CBC
HCT: 38.8 % (ref 36.7–47.1)
HGB: 13.1 g/dL (ref 12.5–16.3)
MCH: 30.4 pg (ref 23.8–33.4)
MCHC: 33.6 g/dL (ref 32.5–36.3)
MCV: 90.3 fL (ref 73.0–96.2)
MPV: 7.7 fL (ref 7.4–11.4)
PLATELETS: 215 10*3/uL (ref 140–440)
RBC: 4.3 10*6/uL (ref 4.06–5.63)
RDW: 13.5 % (ref 12.1–16.2)
WBC: 10.6 10*3/uL — ABNORMAL HIGH (ref 3.6–10.2)

## 2022-06-02 LAB — POC BLOOD GLUCOSE (RESULTS)
GLUCOSE, POC: 104 mg/dl — ABNORMAL HIGH (ref 70–100)
GLUCOSE, POC: 122 mg/dl — ABNORMAL HIGH (ref 70–100)

## 2022-06-02 LAB — MAGNESIUM: MAGNESIUM: 1.9 mg/dL (ref 1.9–2.7)

## 2022-06-02 MED ORDER — PREDNISONE 10 MG TABLET
ORAL_TABLET | ORAL | 0 refills | Status: AC
Start: 2022-06-02 — End: 2022-06-11

## 2022-06-02 MED ORDER — AMOXICILLIN 875 MG-POTASSIUM CLAVULANATE 125 MG TABLET
1.0000 | ORAL_TABLET | Freq: Two times a day (BID) | ORAL | 0 refills | Status: AC
Start: 2022-06-02 — End: 2022-06-16

## 2022-06-02 NOTE — Nurses Notes (Signed)
Patient discharged home. IV access removed, catheter intact and pressure dressing applied. Telemetry removed. Went over new medications and discharge instructions with patient, whom verbalized understanding. Patient is awaiting family member for transportation.

## 2022-06-02 NOTE — Discharge Summary (Signed)
[facility]  DISCHARGE SUMMARY    PATIENT NAME:  Charles Mendoza, Charles Mendoza  MRN:  [id number]  DOB:  Apr 14, 1970    ENCOUNTER DATE:  05/30/2022  INPATIENT ADMISSION DATE: 05/30/2022  DISCHARGE DATE:  06/02/2022    ATTENDING PHYSICIAN: Lalla Brothers, MD  SERVICE: PRN HOSPITALIST 1  PRIMARY CARE PHYSICIAN: Mariah Milling, DO       No lay caregiver identified.    PRIMARY DISCHARGE DIAGNOSIS: Tonsillar abscess  Active Hospital Problems    Diagnosis Date Noted    Principal Problem: Tonsillar abscess [J36] 05/30/2022    Streptococcal bacteremia [R78.81, B95.5] 06/01/2022    Pharyngitis [J02.9] 05/30/2022    Morbid obesity with BMI of 45.0-49.9, adult (CMS HCC) [E66.01, Z68.42] 05/30/2022      Resolved Hospital Problems   No resolved problems to display.     There are no active non-hospital problems to display for this patient.          Current Discharge Medication List        START taking these medications.        Details   amoxicillin-pot clavulanate 875-125 mg Tablet  Commonly known as: AUGMENTIN   1 Tablet, Oral, 2 TIMES DAILY  Qty: 28 Tablet  Refills: 0     predniSONE 10 mg Tablet  Commonly known as: DELTASONE  Start taking on: Jun 02, 2022   Take 4 Tablets (40 mg total) by mouth Once a day for 3 days, THEN 2 Tablets (20 mg total) Once a day for 3 days, THEN 1 Tablet (10 mg total) Once a day for 3 days.  Qty: 21 Tablet  Refills: 0            Discharge med list refreshed?  YES     No Known Allergies  HOSPITAL PROCEDURE(S):   Orders Placed This Encounter   Procedures    BEDSIDE  MISC PROCEDURE       REASON FOR HOSPITALIZATION AND HOSPITAL COURSE   BRIEF HPI:  This is a 52 y.o., male admitted for evaluation of right peritonsillar abscess.      BRIEF HOSPITAL NARRATIVE:   The patient was started on broad-spectrum antibiotic therapy and demonstrated progressive clinical improvement throughout the duration of the hospital stay.  The patient was evaluated by the ENT service and an endoscope showed left tonsillar edema  without laryngeal edema on 05/30/2022.  The patient was noted to have had Streptococcus bacteremia and it is case was reviewed by the Infectious diseases team.  It was recommended for the patient to complete a 14 day course of Augmentin Rx.  The patient will be discharged with plans for him to follow up with his PCP as well as with the ENT within 1 week.  The patient demonstrated the ability to tolerate p.o. medications/nutrition reliably and without issue.  He reported being in his usual state of health on the day of discharge.      TRANSITION/POST DISCHARGE CARE/PENDING TESTS/REFERRALS:  PCP    CONDITION ON DISCHARGE:  A. Ambulation: Full ambulation  B. Self-care Ability: Complete  C. Cognitive Status Alert and Oriented x 3  D. Code status at discharge:   Full code      LINES/DRAINS/WOUNDS AT DISCHARGE:   Patient Lines/Drains/Airways Status       Active Line / Dialysis Catheter / Dialysis Graft / Drain / Airway / Wound       Name Placement date Placement time Site Days    Peripheral IV Right Median Cubital  (antecubital  fossa) 05/30/22  1240  -- 2                    DISCHARGE DISPOSITION:  Home discharge  DISCHARGE INSTRUCTIONS:  Post-Discharge Follow Up Appointments       Follow up with Conchita Paris, DO in 1 week(s)    Phone: [phone]    Where: [facility]    Follow up with Mariah Milling, DO in 1 week(s)    Phone: [phone]    Where: [address], [place] [address]          No discharge procedures on file.       Lalla Brothers, MD    Copies sent to Care Team         Relationship Specialty Notifications Start End    Mariah Milling, DO PCP - General FAMILY MEDICINE  04/03/21     Phone: [phone] Fax: [phone]         [address] [place] [address]            Referring providers can utilize [email or web address] to access their referred [facility] patient's information.

## 2022-06-02 NOTE — Nurses Notes (Signed)
Patient left floor via ambulation with all personal belongings to personal vehicle.

## 2022-06-03 DIAGNOSIS — I330 Acute and subacute infective endocarditis: Secondary | ICD-10-CM

## 2022-06-04 ENCOUNTER — Encounter (INDEPENDENT_AMBULATORY_CARE_PROVIDER_SITE_OTHER): Payer: Self-pay

## 2022-06-04 ENCOUNTER — Other Ambulatory Visit (INDEPENDENT_AMBULATORY_CARE_PROVIDER_SITE_OTHER): Payer: Self-pay

## 2022-06-04 DIAGNOSIS — Z7189 Other specified counseling: Secondary | ICD-10-CM

## 2022-06-04 NOTE — Nursing Note (Signed)
Transition of Care Contact Information  Discharge Date: 06/02/2022  Transition Facility Type--Hospital (Inpatient or Observation)  Facility Name--[facility]  Interactive Contact(s): Completed or attempted contact indicated by Date/Time  First Attempt Call: 06/04/2022  8:52 AM  Second Attempted Contact: 06/04/2022  8:53 AM  Contact Method(s)-- Patient/Caregiver Telephone, MyChart Patient Portal  Clinical Staff Name/Role who contacted--Savalas Monje RN  Transition Note:CTC attempted outreach via telephone for hospital discharge follow up with no avail. Voice mail left identifying myself as well as reason for call and my contact information. A MyChart message was also sent to patient with reasoning for outreach along with my contact information and the Nurse Navigator triage line telephone number.   Will attempt final outreach attempt next business day.   Rebeca Allegra, RN  06/04/2022 08:53     Unable to Complete TCM due to:  Further information may be documented in relevant telephone or outreach encounter.

## 2022-06-05 NOTE — Nursing Note (Signed)
Transition of Care Contact Information  Discharge Date: 06/02/2022  Transition Facility Type--Hospital (Inpatient or Observation)  Facility Name--[facility]  Interactive Contact(s): Completed or attempted contact indicated by Date/Time  First Attempt Call: 06/04/2022  8:52 AM  Second Attempted Contact: 06/04/2022  8:53 AM  Third Attempted Contact: 06/05/2022  9:29 AM  Contact Method(s)-- Patient/Caregiver Telephone, MyChart Patient Portal  Clinical Staff Name/Role who contacted--Jennika Ringgold RN  Transition Note:CTC attempted outreach via telephone for hospital discharge follow up with no avail. Voice mail left identifying myself as well as reason for call and my contact information. A MyChart message was also sent to patient with reasoning for outreach along with my contact information and the Nurse Navigator triage line telephone number.   Will attempt final outreach attempt next business day.   Rebeca Allegra, RN  06/04/2022 08:53   06/05/2022 CTC final outreach attempt no answer LVM.  CTC closing TOC program at this time. Rebeca Allegra, RN  06/05/2022 09:30     Unable to Complete TCM due to:  Further information may be documented in relevant telephone or outreach encounter.

## 2022-06-06 LAB — ADULT ROUTINE BLOOD CULTURE, SET OF 2 BOTTLES (BACTERIA AND YEAST): BLOOD CULTURE, ROUTINE: NO GROWTH

## 2022-06-11 ENCOUNTER — Telehealth (HOSPITAL_COMMUNITY): Payer: Self-pay

## 2022-06-11 ENCOUNTER — Other Ambulatory Visit: Payer: Self-pay

## 2022-06-11 ENCOUNTER — Ambulatory Visit (INDEPENDENT_AMBULATORY_CARE_PROVIDER_SITE_OTHER): Payer: No Typology Code available for payment source | Admitting: OTOLARYNGOLOGY

## 2022-06-11 ENCOUNTER — Encounter (INDEPENDENT_AMBULATORY_CARE_PROVIDER_SITE_OTHER): Payer: Self-pay | Admitting: OTOLARYNGOLOGY

## 2022-06-11 VITALS — Ht 67.0 in | Wt 299.0 lb

## 2022-06-11 DIAGNOSIS — J36 Peritonsillar abscess: Secondary | ICD-10-CM

## 2022-06-11 DIAGNOSIS — K029 Dental caries, unspecified: Secondary | ICD-10-CM

## 2022-06-11 NOTE — H&P (Signed)
[facility]  [address]  [place] [address]    Return Patient Visit    Name: Charles Mendoza Maricle MRN:  [id number]   Date: 06/11/2022 DOB: Nov 30, 1970 (52 y.o.)       Referring Provider:  No ref. provider found    Reason for Visit:   Chief Complaint   Patient presents with    Tonsillar Abscess     1 week rc on tonsillar abscess. States doing much better. No sore throat.        History of Present Illness:  Charles Mendoza is a 52 y.o. male who is FU after inpatient consult by Dr. Hulan Saas on 05/30/22  He is taking the antibiotic and steroid and feels back to normal. Tolerating a regular po diet. Denies throat pain, neck swelling, fever.       Patient History:  Patient Active Problem List   Diagnosis    Pharyngitis    Tonsillar abscess    Morbid obesity with BMI of 45.0-49.9, adult (CMS HCC)    Streptococcal bacteremia     Current Outpatient Medications   Medication Sig    amoxicillin-pot clavulanate (AUGMENTIN) 875-125 mg Oral Tablet Take 1 Tablet by mouth Twice daily for 14 days    predniSONE (DELTASONE) 10 mg Oral Tablet Take 4 Tablets (40 mg total) by mouth Once a day for 3 days, THEN 2 Tablets (20 mg total) Once a day for 3 days, THEN 1 Tablet (10 mg total) Once a day for 3 days.      No Known Allergies  Past Medical History:   Diagnosis Date    Borderline type 2 diabetes mellitus     Cellulitis      Past Surgical History:   Procedure Laterality Date    ADENOIDECTOMY       Family Medical History:       Problem Relation (Age of Onset)    No Known Problems Mother, Father            Social History     Tobacco Use    Smoking status: Every Day     Current packs/day: 1.00     Types: Cigarettes    Smokeless tobacco: Never   Vaping Use    Vaping status: Never Used   Substance Use Topics    Alcohol use: Never    Drug use: Never       Review of Systems:  Review of Systems    Physical Exam:  Ht 1.702 m (5\' 7" )   Wt 136 kg (299 lb)   BMI 46.83 kg/m       ENT Physical Exam  Constitutional  Appearance: patient  appears well-developed, well-nourished and well-groomed, patient is cooperative;  Communication/Voice: communication appropriate for developmental age; vocal quality normal;  Head and Face  Appearance: head appears normal, face appears normal and face appears atraumatic;  Salivary: glands normal;  Ear  Auricles: right auricle normal; left auricle normal;  Ear Canals: right ear canal normal; left ear canal normal;  Tympanic Membranes: right tympanic membrane normal; left tympanic membrane normal;  Nose  External Nose: nares patent bilaterally; external nose normal;  Internal Nose: bilateral intranasal mucosa edematous; nasal septal deviation present; bilateral inferior turbinates with hypertrophy;  Oral Cavity/Oropharynx  Lips: normal;  Tongue: normal;  Oral mucosa: normal;  Hard palate: normal;  Soft palate: normal;  Tonsils: bilateral tonsils 3+,  Neck  Neck: neck normal; neck palpation normal;  Thyroid: thyroid normal;  Respiratory  Inspection: breathing unlabored;  normal breathing rate;  Cardiovascular  Inspection: extremities are warm and well perfused;  Neurovestibular  Mental Status: alert and oriented;  Psychiatric: mood normal;  Cranial Nerves: cranial nerves intact;       Assessment:  ENCOUNTER DIAGNOSES     ICD-10-CM   1. Peritonsillar abscess  J36   2. Dental caries  K02.9       Plan:  Medical records reviewed on 06/11/2022.  Resolved PTA. Finish augmentin  Orders Placed This Encounter    [address] - LARYNGOSCOPY, FLEXIBLE DIAGNOSTIC (AMB ONLY)     Return if symptoms worsen or fail to improve.    Marcelline Deist, PA-C  The advanced practice clinician's documentation was reviewed/amended in its entirety with the assessment and plan portion completely performed independently by me during this separate encounter.

## 2022-06-11 NOTE — Procedures (Signed)
[facility]  [address]  [place] [address]    Procedure Note    Name: Charles Mendoza MRN:  [id number]   Date: 06/11/2022 DOB:  May 29, 1970 (51 y.o.)         [address] - LARYNGOSCOPY, FLEXIBLE DIAGNOSTIC (AMB ONLY)    Performed by: Conchita Paris, DO  Authorized by: Conchita Paris, DO    Time Out:     Immediately before the procedure, a time out was called:  Yes    Patient verified:  Yes    Procedure Verified:  Yes    Site Verified:  Yes  Documentation:      [facility]  [address] [place] [address]    Procedure Note    Name: Charles Mendoza MRN:  [id number]  Date: 06/11/2022 DOB:  [birth year] (51 y.o.)        @PROCDOC @    Indications for procedure: Head / Neck masses    Anesthesia: Oxymetazoline nasal spray    Description: The flexible endoscope was gently introduced into the nostril and passed along the floor of the nose to the nasopharynx. Adenoid was minimal and eustachian tubes normal. The retropalatal airway was patent.    The endoscope was passed to the oropharynx. Base of tongue displayed normal lingual tonsils, patent valelulla, and sharply defined upright epiglottis. Retrolingual airway was patent.    The larynx displayed normal true vocal cords with good mobility. False cords were normal. Arytenoid mucosa was pink with no edema.     The piriform recesses were symmetric without secretion. The hypopharynx was symmetric without lesion.    Findings: Symmetrical true vocal fold motion    The patient tolerated the procedure well.    Conchita Paris, DO                 Charles Mendoza [place], DO

## 2023-03-20 ENCOUNTER — Encounter (HOSPITAL_COMMUNITY): Payer: Self-pay

## 2023-03-20 ENCOUNTER — Emergency Department: Admission: EM | Admit: 2023-03-20 | Discharge: 2023-03-21 | Disposition: A | Discharge status: Home / Self Care

## 2023-03-20 ENCOUNTER — Other Ambulatory Visit: Payer: Self-pay

## 2023-03-20 ENCOUNTER — Emergency Department (HOSPITAL_COMMUNITY)

## 2023-03-20 DIAGNOSIS — L0231 Cutaneous abscess of buttock: Secondary | ICD-10-CM

## 2023-03-20 DIAGNOSIS — N2889 Other specified disorders of kidney and ureter: Secondary | ICD-10-CM | POA: Insufficient documentation

## 2023-03-20 DIAGNOSIS — L03317 Cellulitis of buttock: Secondary | ICD-10-CM

## 2023-03-20 LAB — CBC WITH DIFF
BASOPHIL #: 0 10*3/uL (ref 0.00–0.10)
BASOPHIL %: 1 % (ref 0–1)
EOSINOPHIL #: 0.2 10*3/uL (ref 0.00–0.50)
EOSINOPHIL %: 2 % (ref 1–8)
HCT: 42.6 % (ref 36.7–47.1)
HGB: 14.1 g/dL (ref 12.5–16.3)
LYMPHOCYTE #: 1.6 10*3/uL (ref 1.00–3.20)
LYMPHOCYTE %: 20 % (ref 15–43)
MCH: 29.5 pg (ref 23.8–33.4)
MCHC: 33.1 g/dL (ref 32.5–36.3)
MCV: 89.1 fL (ref 73.0–96.2)
MONOCYTE #: 0.8 10*3/uL (ref 0.30–1.10)
MONOCYTE %: 10 % (ref 6–14)
MPV: 7.4 fL (ref 7.4–11.4)
NEUTROPHIL #: 5.5 10*3/uL (ref 1.70–7.60)
NEUTROPHIL %: 68 % (ref 44–74)
PLATELETS: 221 10*3/uL (ref 140–440)
RBC: 4.78 10*6/uL (ref 4.06–5.63)
RDW: 13.2 % (ref 12.1–16.2)
WBC: 8.1 10*3/uL (ref 3.6–10.2)

## 2023-03-20 LAB — COMPREHENSIVE METABOLIC PANEL, NON-FASTING
ALBUMIN/GLOBULIN RATIO: 1.3 (ref 0.8–1.4)
ALBUMIN: 4 g/dL (ref 3.5–5.7)
ALKALINE PHOSPHATASE: 61 U/L (ref 34–104)
ALT (SGPT): 24 U/L (ref 7–52)
ANION GAP: 7 mmol/L (ref 4–13)
AST (SGOT): 16 U/L (ref 13–39)
BILIRUBIN TOTAL: 0.3 mg/dL (ref 0.3–1.0)
BUN/CREA RATIO: 18 (ref 6–22)
BUN: 16 mg/dL (ref 7–25)
CALCIUM, CORRECTED: 9.3 mg/dL (ref 8.9–10.8)
CALCIUM: 9.3 mg/dL (ref 8.6–10.3)
CHLORIDE: 104 mmol/L (ref 98–107)
CO2 TOTAL: 29 mmol/L (ref 21–31)
CREATININE: 0.87 mg/dL (ref 0.60–1.30)
ESTIMATED GFR: 105 mL/min/{1.73_m2} (ref >59)
GLOBULIN: 3 (ref 2.0–3.5)
GLUCOSE: 99 mg/dL (ref 74–109)
OSMOLALITY, CALCULATED: 281 mosm/kg (ref 270–290)
POTASSIUM: 4.1 mmol/L (ref 3.5–5.1)
PROTEIN TOTAL: 7 g/dL (ref 6.4–8.9)
SODIUM: 140 mmol/L (ref 136–145)

## 2023-03-20 LAB — LACTIC ACID LEVEL W/ REFLEX FOR LEVEL >2.0: LACTIC ACID: 0.5 mmol/L (ref 0.5–2.2)

## 2023-03-20 MED ORDER — SODIUM CHLORIDE 0.9 % (FLUSH) INJECTION SYRINGE
3.0000 mL | INJECTION | INTRAMUSCULAR | Status: DC | PRN
Start: 2023-03-20 — End: 2023-03-21

## 2023-03-20 MED ORDER — IOHEXOL 350 MG IODINE/ML INTRAVENOUS SOLUTION
50.0000 mL | INTRAVENOUS | Status: AC
Start: 2023-03-20 — End: 2023-03-20
  Administered 2023-03-20: 75 mL via INTRAVENOUS

## 2023-03-20 MED ORDER — SODIUM CHLORIDE 0.9 % (FLUSH) INJECTION SYRINGE
3.0000 mL | INJECTION | Freq: Three times a day (TID) | INTRAMUSCULAR | Status: DC
Start: 2023-03-20 — End: 2023-03-21
  Administered 2023-03-20: 0 mL

## 2023-03-20 NOTE — ED APP Handoff Note (Signed)
 [facility] - Emergency Department  Emergency Department  Provider in Triage Note    Name: Burnell Hurta League  Age: 53 y.o.  Gender: male     Subjective:   Thierno Hun Blane is a 53 y.o. male who presents with complaint of Skin Problem  .  Patient here stating "I have blood spraying out of my MRSA wound on my left hip" Reports skin ulcer to L hip x 1 week.     Objective:   Filed Vitals:    03/20/23 2024   BP: (!) 131/96   Pulse: 89   Resp: 20   Temp: 36.7 C (98 F)   SpO2: 96%        Assessment:  A medical screening exam was completed.  This patient is a 53 y.o. male with initial findings showing skin problem     Plan:  Please see initial orders and work-up below.  This is to be continued with full evaluation in the main Emergency Department.     No current facility-administered medications for this encounter.     No results found for this or any previous visit (from the past 24 hours).     Sherlie Ban, FNP, ENP-C  03/20/2023, 20:25

## 2023-03-20 NOTE — ED Provider Notes (Incomplete)
 Emergency Medicine      Name: Chritopher Coster Christopoulos  Age and Gender: 53 y.o. male  Date of Birth: 10-30-1970  MRN: [id number]  PCP: Mariah Milling, DO    CC:  Chief Complaint   Patient presents with   . Skin Problem       HPI:  Charles Mendoza is a 53 y.o. White male who presents to the ER with abscess to left buttocks.  Patient reports present greater than a week.  Patient denies seeking medical attention.  States he has been able to care for it at home.  States he developed concern today when he felt something warm run down his leg and noticed that it was blood.  Pt stated "I feel like I lost a lot of blood".  Patient denies any medical history.  Patient denies medications at home.  Patient states the area started out like a pimple over a week ago that he is cared for at home with use of bandages, OTC antibiotic ointment and squeezing.    Below pertinent information reviewed with patient:  Past Medical History:   Diagnosis Date   . Borderline type 2 diabetes mellitus    . Cellulitis            No Known Allergies    Past Surgical History:   Procedure Laterality Date   . ADENOIDECTOMY          Social History     Socioeconomic History   . Marital status: Single   Tobacco Use   . Smoking status: Every Day     Current packs/day: 1.00     Types: Cigarettes   . Smokeless tobacco: Never   Vaping Use   . Vaping status: Never Used   Substance and Sexual Activity   . Alcohol use: Never   . Drug use: Never   . Sexual activity: Not Currently     Social Determinants of Health     Social Connections: Low Risk  (05/30/2022)    Social Connections    . SDOH Social Isolation: 5 or more times a week       ROS:  No other overt positive review of systems are noted other than stated in the HPI.      Objective:    ED Triage Vitals [03/20/23 2024]   BP (Non-Invasive) (!) 131/96   Heart Rate 89   Respiratory Rate 20   Temperature 36.7 C (98 F)   SpO2 96 %   Weight 136 kg (300 lb)   Height 1.753 m (5\' 9" )     Filed Vitals:    03/20/23 2024 03/20/23  2230 03/20/23 2245 03/20/23 2300   BP: (!) 131/96 131/81     Pulse: 89 96 84 90   Resp: 20 18 14 18    Temp: 36.7 C (98 F)      SpO2: 96% 96% 98% 98%       Nursing notes and vital signs reviewed.    Constitutional - No acute distress.  Alert and Active.  HEENT - Normocephalic. Atraumatic. PERRL. EOMI. Conjunctiva clear. Moist mucous membranes.   Neck - Trachea midline. No stridor. No hoarseness.  Cardiac - Regular rate and rhythm. Intact distal pulses.  Respiratory/Chest - Normal respiratory effort.   Musculoskeletal - Good AROM. No muscle or joint tenderness appreciated. No clubbing, cyanosis or edema.  Skin - Warm and dry, without any rashes. Large circular wound to left buttocks; see photo included in EHR.  Neuro - Alert and  oriented x 3. Cranial nerves II-XII are grossly intact.  Moving all extremities symmetrically. Normal gait.  Psych - Normal mood and affect. Behavior is normal        Any pertinent labs and imaging obtained during this encounter reviewed below in MDM.    MDM/ED Course:    Medical Decision Making  Dutch Ing Malmberg is a 53 y.o. White male who presents to the ER with abscess to left buttocks.  Patient reports present greater than a week.  Patient denies seeking medical attention.  States he has been able to care for it at home.  States he developed concern today when he felt something warm run down his leg and noticed that it was blood.  Pt stated "I feel like I lost a lot of blood".  Patient denies any medical history.  Patient denies medications at home.  Patient states the area started out like a pimple over a week ago that he is cared for at home with use of bandages, OTC antibiotic ointment and squeezing.    Large abscess left buttocks.  Not actively draining.  See photos included in EHR.    Differential diagnosis include but are not limited to: Abscess/cellulitis, necrotizing fasciitis, MRSA    Diagnostics will include labs, radiology    Please refer to ED course notes for additional  documentation.    Discussed with ED attending.    Amount and/or Complexity of Data Reviewed  Labs: ordered.  Radiology: ordered.    Risk  Prescription drug management.              Critical care:   Critical Care Attestation:  As the ED {provider type:48533}, I have provided critical care for this patient.  This patient has high probability of imminent life or limb threatening deterioration due to [facility] CRITICALLY ZOX:[id number]}.  I provided {INTERVENTIONS:48529::"direct patient care","documentation of findings","review of medical records"} and frequent reassessment.  Time spent providing critical care (exclusive of any billed procedures and/or teaching): *** minutes.    ED Course as of 03/21/23 0006   Fri Mar 21, 2023   0001 CT ABDOMEN PELVIS W IV CONTRAST  No obvious fat stranding in the region of the left buttocks.        0001 CT ABDOMEN PELVIS W IV CONTRAST  IMPRESSION:  2 to 3 mm calcification at the left UVJ. No hydronephrosis.             Orders Placed This Encounter   . WOUND, SUPERFICIAL/NON-STERILE SITE, AEROBIC CULTURE AND GRAM STAIN   . ANAEROBIC CULTURE   . ADULT ROUTINE BLOOD CULTURE, SET OF 2 ADULT BOTTLES (BACTERIA AND YEAST)   . ADULT ROUTINE BLOOD CULTURE, SET OF 2 ADULT BOTTLES (BACTERIA AND YEAST)   . CT ABDOMEN PELVIS W IV CONTRAST   . CBC/DIFF   . COMPREHENSIVE METABOLIC PANEL, NON-FASTING   . LACTIC ACID LEVEL W/ REFLEX FOR LEVEL >2.0   . CBC WITH DIFF   . INSERT & MAINTAIN PERIPHERAL IV ACCESS   . PERIPHERAL IV DRESSING CHANGE   . NS flush syringe   . NS flush syringe   . iohexol (OMNIPAQUE 350) infusion         Any procedures:  Procedures    Impression:   Clinical Impression   Cellulitis and abscess of buttock (Primary)       Disposition: Discharged    / Corinna Lines, FNP-BC  03/20/2023, 22:11  [facility]  Department of Emergency Medicine  [facility]  Portions of this note may have been dictated using voice recognition software.      -----------------------  Results for orders placed or performed during the hospital encounter of 03/20/23 (from the past 12 hours)   COMPREHENSIVE METABOLIC PANEL, NON-FASTING   Result Value Ref Range    SODIUM 140 136 - 145 mmol/L    POTASSIUM 4.1 3.5 - 5.1 mmol/L    CHLORIDE 104 98 - 107 mmol/L    CO2 TOTAL 29 21 - 31 mmol/L    ANION GAP 7 4 - 13 mmol/L    BUN 16 7 - 25 mg/dL    CREATININE 1.61 0.96 - 1.30 mg/dL    BUN/CREA RATIO 18 6 - 22    ESTIMATED GFR 105 >59 mL/min/1.48m^2    ALBUMIN 4.0 3.5 - 5.7 g/dL    CALCIUM 9.3 8.6 - 04.5 mg/dL    GLUCOSE 99 74 - 409 mg/dL    ALKALINE PHOSPHATASE 61 34 - 104 U/L    ALT (SGPT) 24 7 - 52 U/L    AST (SGOT) 16 13 - 39 U/L    BILIRUBIN TOTAL 0.3 0.3 - 1.0 mg/dL    PROTEIN TOTAL 7.0 6.4 - 8.9 g/dL    ALBUMIN/GLOBULIN RATIO 1.3 0.8 - 1.4    OSMOLALITY, CALCULATED 281 270 - 290 mOsm/kg    CALCIUM, CORRECTED 9.3 8.9 - 10.8 mg/dL    GLOBULIN 3.0 2.0 - 3.5   LACTIC ACID LEVEL W/ REFLEX FOR LEVEL >2.0   Result Value Ref Range    LACTIC ACID 0.5 0.5 - 2.2 mmol/L   CBC WITH DIFF   Result Value Ref Range    WBC 8.1 3.6 - 10.2 x10^3/uL    RBC 4.78 4.06 - 5.63 x10^6/uL    HGB 14.1 12.5 - 16.3 g/dL    HCT 81.1 91.4 - 78.2 %    MCV 89.1 73.0 - 96.2 fL    MCH 29.5 23.8 - 33.4 pg    MCHC 33.1 32.5 - 36.3 g/dL    RDW 95.6 21.3 - 08.6 %    PLATELETS 221 140 - 440 x10^3/uL    MPV 7.4 7.4 - 11.4 fL    NEUTROPHIL % 68 44 - 74 %    LYMPHOCYTE % 20 15 - 43 %    MONOCYTE % 10 6 - 14 %    EOSINOPHIL % 2 1 - 8 %    BASOPHIL % 1 0 - 1 %    NEUTROPHIL # 5.50 1.70 - 7.60 x10^3/uL    LYMPHOCYTE # 1.60 1.00 - 3.20 x10^3/uL    MONOCYTE # 0.80 0.30 - 1.10 x10^3/uL    EOSINOPHIL # 0.20 0.00 - 0.50 x10^3/uL    BASOPHIL # 0.00 0.00 - 0.10 x10^3/uL     CT ABDOMEN PELVIS W IV CONTRAST   Final Result   2 to 3 mm calcification at the left UVJ. No hydronephrosis.          One or more dose reduction techniques were used (e.g., Automated exposure control, adjustment of the mA and/or kV according to patient size,  use of iterative reconstruction technique).         Radiologist location ID: [id number]

## 2023-03-20 NOTE — ED Triage Notes (Signed)
"  I have blood spraying out of MRSA wound on my left hip", bleeding has since resolved, open area to left hip x one week, denies fever

## 2023-03-20 NOTE — ED Provider Notes (Signed)
 Emergency Medicine      Name: Charles Mendoza  Age and Gender: 53 y.o. male  Date of Birth: [birth year]  MRN: [id number]  PCP: Mariah Milling, DO    CC:  Chief Complaint   Patient presents with    Skin Problem       HPI:  Charles Mendoza is a 53 y.o. White male who presents to the ER with abscess to left buttocks.  Patient reports present greater than a week.  Patient denies seeking medical attention.  States he has been able to care for it at home.  States he developed concern today when he felt something warm run down his leg and noticed that it was blood.  Pt stated "I feel like I lost a lot of blood".  Patient denies any medical history.  Patient denies medications at home.  Patient states the area started out like a pimple over a week ago that he is cared for at home with use of bandages, OTC antibiotic ointment and squeezing.    Below pertinent information reviewed with patient:  Past Medical History:   Diagnosis Date    Borderline type 2 diabetes mellitus     Cellulitis            No Known Allergies    Past Surgical History:   Procedure Laterality Date    ADENOIDECTOMY          Social History     Socioeconomic History    Marital status: Single   Tobacco Use    Smoking status: Every Day     Current packs/day: 1.00     Types: Cigarettes    Smokeless tobacco: Never   Vaping Use    Vaping status: Never Used   Substance and Sexual Activity    Alcohol use: Never    Drug use: Never    Sexual activity: Not Currently     Social Determinants of Health     Social Connections: Low Risk  (05/30/2022)    Social Connections     SDOH Social Isolation: 5 or more times a week       ROS:  No other overt positive review of systems are noted other than stated in the HPI.      Objective:    ED Triage Vitals [03/20/23 2024]   BP (Non-Invasive) (!) 131/96   Heart Rate 89   Respiratory Rate 20   Temperature 36.7 C (98 F)   SpO2 96 %   Weight 136 kg (300 lb)   Height 1.753 m (5\' 9" )     Filed Vitals:    03/20/23 2024 03/20/23 2230 03/20/23  2245 03/20/23 2300   BP: (!) 131/96 131/81     Pulse: 89 96 84 90   Resp: 20 18 14 18    Temp: 36.7 C (98 F)      SpO2: 96% 96% 98% 98%       Nursing notes and vital signs reviewed.    Constitutional - No acute distress.  Alert and Active.  HEENT - Normocephalic. Atraumatic. PERRL. EOMI. Conjunctiva clear. Moist mucous membranes.   Neck - Trachea midline. No stridor. No hoarseness.  Cardiac - Regular rate and rhythm. Intact distal pulses.  Respiratory/Chest - Normal respiratory effort.   Musculoskeletal - Good AROM. No muscle or joint tenderness appreciated. No clubbing, cyanosis or edema.  Skin - Warm and dry, without any rashes. Large circular wound to left buttocks; see photo included in EHR.  Neuro - Alert and  oriented x 3. Cranial nerves II-XII are grossly intact.  Moving all extremities symmetrically. Normal gait.  Psych - Normal mood and affect. Behavior is normal        Any pertinent labs and imaging obtained during this encounter reviewed below in MDM.    MDM/ED Course:    Medical Decision Making  Charles Mendoza is a 53 y.o. White male who presents to the ER with abscess to left buttocks.  Patient reports present greater than a week.  Patient denies seeking medical attention.  States he has been able to care for it at home.  States he developed concern today when he felt something warm run down his leg and noticed that it was blood.  Pt stated "I feel like I lost a lot of blood".  Patient denies any medical history.  Patient denies medications at home.  Patient states the area started out like a pimple over a week ago that he is cared for at home with use of bandages, OTC antibiotic ointment and squeezing.    Large abscess left buttocks.  Not actively draining.  See photos included in EHR.    Differential diagnosis include but are not limited to: Abscess/cellulitis, necrotizing fasciitis, MRSA    Diagnostics will include labs, radiology    Please refer to ED course notes for additional  documentation.    Discussed with ED attending.    Diagnostics are negative as well as CT scan, no fat stranding noted in the left buttocks.  Treating cellulitis/abscess with antibiotics.  We will continue with p.o. upon discharge to home.  Patient instructed to follow up with PCP in 1-2 days or return to the emergency department for wound check.  All questions answered to patient's satisfaction.  Patient indicated and verbalized understanding and agreement with this plan of care.    Amount and/or Complexity of Data Reviewed  Labs: ordered.  Radiology: ordered. Decision-making details documented in ED Course.    Risk  Prescription drug management.          ED Course as of 03/21/23 0029   Fri Mar 21, 2023   0001 CT ABDOMEN PELVIS W IV CONTRAST  No obvious fat stranding in the region of the left buttocks.        0001 CT ABDOMEN PELVIS W IV CONTRAST  IMPRESSION:  2 to 3 mm calcification at the left UVJ. No hydronephrosis.             Orders Placed This Encounter    WOUND, SUPERFICIAL/NON-STERILE SITE, AEROBIC CULTURE AND GRAM STAIN    ANAEROBIC CULTURE    ADULT ROUTINE BLOOD CULTURE, SET OF 2 ADULT BOTTLES (BACTERIA AND YEAST)    ADULT ROUTINE BLOOD CULTURE, SET OF 2 ADULT BOTTLES (BACTERIA AND YEAST)    CT ABDOMEN PELVIS W IV CONTRAST    CBC/DIFF    COMPREHENSIVE METABOLIC PANEL, NON-FASTING    LACTIC ACID LEVEL W/ REFLEX FOR LEVEL >2.0    CBC WITH DIFF    INSERT & MAINTAIN PERIPHERAL IV ACCESS    PERIPHERAL IV DRESSING CHANGE    NS flush syringe    NS flush syringe    iohexol (OMNIPAQUE 350) infusion    clindamycin (CLEOCIN) 900 mg in NS 50 mL premix IVPB    mupirocin (BACTROBAN) 2% topical ointment    clindamycin (CLEOCIN) 150 mg Oral Capsule    mupirocin (BACTROBAN) 2 % Ointment         Any procedures:  Procedures    Impression:  Clinical Impression   Cellulitis and abscess of buttock (Primary)       Disposition: Discharged    / Corinna Lines, FNP-BC  03/20/2023, 22:11  [facility]  Department of  Emergency Medicine  [facility]    Portions of this note may have been dictated using voice recognition software.     -----------------------  Results for orders placed or performed during the hospital encounter of 03/20/23 (from the past 12 hours)   COMPREHENSIVE METABOLIC PANEL, NON-FASTING   Result Value Ref Range    SODIUM 140 136 - 145 mmol/L    POTASSIUM 4.1 3.5 - 5.1 mmol/L    CHLORIDE 104 98 - 107 mmol/L    CO2 TOTAL 29 21 - 31 mmol/L    ANION GAP 7 4 - 13 mmol/L    BUN 16 7 - 25 mg/dL    CREATININE 1.61 0.96 - 1.30 mg/dL    BUN/CREA RATIO 18 6 - 22    ESTIMATED GFR 105 >59 mL/min/1.75m^2    ALBUMIN 4.0 3.5 - 5.7 g/dL    CALCIUM 9.3 8.6 - 04.5 mg/dL    GLUCOSE 99 74 - 409 mg/dL    ALKALINE PHOSPHATASE 61 34 - 104 U/L    ALT (SGPT) 24 7 - 52 U/L    AST (SGOT) 16 13 - 39 U/L    BILIRUBIN TOTAL 0.3 0.3 - 1.0 mg/dL    PROTEIN TOTAL 7.0 6.4 - 8.9 g/dL    ALBUMIN/GLOBULIN RATIO 1.3 0.8 - 1.4    OSMOLALITY, CALCULATED 281 270 - 290 mOsm/kg    CALCIUM, CORRECTED 9.3 8.9 - 10.8 mg/dL    GLOBULIN 3.0 2.0 - 3.5   LACTIC ACID LEVEL W/ REFLEX FOR LEVEL >2.0   Result Value Ref Range    LACTIC ACID 0.5 0.5 - 2.2 mmol/L   CBC WITH DIFF   Result Value Ref Range    WBC 8.1 3.6 - 10.2 x10^3/uL    RBC 4.78 4.06 - 5.63 x10^6/uL    HGB 14.1 12.5 - 16.3 g/dL    HCT 81.1 91.4 - 78.2 %    MCV 89.1 73.0 - 96.2 fL    MCH 29.5 23.8 - 33.4 pg    MCHC 33.1 32.5 - 36.3 g/dL    RDW 95.6 21.3 - 08.6 %    PLATELETS 221 140 - 440 x10^3/uL    MPV 7.4 7.4 - 11.4 fL    NEUTROPHIL % 68 44 - 74 %    LYMPHOCYTE % 20 15 - 43 %    MONOCYTE % 10 6 - 14 %    EOSINOPHIL % 2 1 - 8 %    BASOPHIL % 1 0 - 1 %    NEUTROPHIL # 5.50 1.70 - 7.60 x10^3/uL    LYMPHOCYTE # 1.60 1.00 - 3.20 x10^3/uL    MONOCYTE # 0.80 0.30 - 1.10 x10^3/uL    EOSINOPHIL # 0.20 0.00 - 0.50 x10^3/uL    BASOPHIL # 0.00 0.00 - 0.10 x10^3/uL     CT ABDOMEN PELVIS W IV CONTRAST   Final Result   2 to 3 mm calcification at the left UVJ. No hydronephrosis.          One or more  dose reduction techniques were used (e.g., Automated exposure control, adjustment of the mA and/or kV according to patient size, use of iterative reconstruction technique).         Radiologist location ID: [id number]

## 2023-03-21 MED ORDER — MUPIROCIN 2 % TOPICAL OINTMENT
TOPICAL_OINTMENT | CUTANEOUS | Status: AC
Start: 2023-03-21 — End: 2023-03-21
  Filled 2023-03-21: qty 22

## 2023-03-21 MED ORDER — CLINDAMYCIN 900 MG/50 ML IN 0.9% SODIUM CHLORIDE INTRAVENOUS PIGGYBACK
INJECTION | INTRAVENOUS | Status: AC
Start: 2023-03-21 — End: 2023-03-21
  Filled 2023-03-21: qty 50

## 2023-03-21 MED ORDER — CLINDAMYCIN 900 MG/50 ML IN 0.9% SODIUM CHLORIDE INTRAVENOUS PIGGYBACK
900.0000 mg | INJECTION | INTRAVENOUS | Status: AC
Start: 2023-03-21 — End: 2023-03-21
  Administered 2023-03-21: 900 mg via INTRAVENOUS
  Administered 2023-03-21: 0 mg via INTRAVENOUS

## 2023-03-21 MED ORDER — MUPIROCIN 2 % TOPICAL OINTMENT
TOPICAL_OINTMENT | Freq: Three times a day (TID) | CUTANEOUS | 0 refills | Status: AC
Start: 2023-03-21 — End: 2023-03-31

## 2023-03-21 MED ORDER — MUPIROCIN 2 % TOPICAL OINTMENT
TOPICAL_OINTMENT | CUTANEOUS | Status: AC
Start: 2023-03-21 — End: 2023-03-21

## 2023-03-21 MED ORDER — CLINDAMYCIN HCL 150 MG CAPSULE
450.0000 mg | ORAL_CAPSULE | Freq: Three times a day (TID) | ORAL | 0 refills | Status: AC
Start: 2023-03-21 — End: 2023-03-28

## 2023-03-21 NOTE — ED Nurses Note (Signed)
 Patient discharged home with family. AVS reviewed with patient. A written copy of the AVS and discharge instructions were given to the patient. Questions sufficiently answered as needed. Patient encouraged to follow up with PCP as indicated. In the event of an emergency, patient instructed to call 911 or go to the nearest emergency room. Patient left department via ambulation.

## 2023-03-21 NOTE — Discharge Instructions (Signed)
 Keep area clean dry and covered.  Follow up with your primary care provider in 1-2 days for wound check.  Return to the emergency department if symptoms persist, worsen or new symptoms of concern develop.

## 2023-03-24 ENCOUNTER — Ambulatory Visit (HOSPITAL_COMMUNITY): Payer: Self-pay | Admitting: NURSE PRACTITIONER

## 2023-03-24 LAB — WOUND, SUPERFICIAL/NON-STERILE SITE, AEROBIC CULTURE AND GRAM STAIN: GRAM STAIN: NONE SEEN

## 2023-03-25 LAB — ANAEROBIC CULTURE

## 2023-03-26 LAB — ADULT ROUTINE BLOOD CULTURE, SET OF 2 BOTTLES (BACTERIA AND YEAST)
BLOOD CULTURE, ROUTINE: NO GROWTH
BLOOD CULTURE, ROUTINE: NO GROWTH
